# Patient Record
Sex: Male | Born: 1950 | Race: White | Hispanic: No | Marital: Married | Smoking: Former smoker
Health system: Southern US, Community
[De-identification: ages and names within clinical notes are randomized; demographics above are authoritative.]

## PROBLEM LIST (undated history)

## (undated) DIAGNOSIS — E7800 Pure hypercholesterolemia, unspecified: Secondary | ICD-10-CM

## (undated) HISTORY — PX: CHOLECYSTECTOMY: SHX55

---

## 2008-03-17 ENCOUNTER — Inpatient Hospital Stay (HOSPITAL_COMMUNITY): Admission: EM | Admit: 2008-03-17 | Discharge: 2008-03-19 | Payer: Self-pay | Admitting: Emergency Medicine

## 2008-03-18 ENCOUNTER — Encounter (INDEPENDENT_AMBULATORY_CARE_PROVIDER_SITE_OTHER): Payer: Self-pay | Admitting: General Surgery

## 2008-10-31 ENCOUNTER — Emergency Department: Payer: Self-pay | Admitting: Internal Medicine

## 2010-09-13 ENCOUNTER — Ambulatory Visit: Payer: Self-pay | Admitting: General Surgery

## 2011-04-04 NOTE — Discharge Summary (Signed)
NAME:  Ian Hansen, Ian Hansen NO.:  [id number]   MEDICAL RECORD NO.:  [id number]          PATIENT TYPE:  INP   LOCATION:  A322                          FACILITY:  [facility]   PHYSICIAN:  Dalia Heading, M.D.  DATE OF BIRTH:  1951/10/06   DATE OF ADMISSION:  03/17/2008  DATE OF DISCHARGE:  04/30/2009LH                               DISCHARGE SUMMARY   HOSPITAL COURSE SUMMARY:  The patient is a 60 year old white male who  presented to the emergency room with right upper quadrant abdominal pain  and biliary colic symptoms.  Ultrasound of the gallbladder revealed  acute cholecystitis with cholelithiasis.  Surgery consultation was  obtained and the patient was admitted to the hospital for further  evaluation and treatment.  He subsequently went to the operating room on  March 18, 2008, and underwent laparoscopic cholecystectomy.  He  tolerated the procedure well.  His postoperative course has been  remarkable only for a low-grade fever which has since resolved.  The  patient's diet was advanced without difficulty.   The patient is being discharged home on March 19, 2008, in good and  improving condition.   DISCHARGE INSTRUCTIONS:  The patient is to follow up Dr. Franky Macho on  Mar 26, 2008.   DISCHARGE MEDICATIONS:  1. Vicodin 1-2 tablets p.o. q.4 hours p.r.n. pain.  2. Bromelain daily.  3. Fish oil daily.  4. Multivitamin daily.   PRINCIPAL DIAGNOSES:  1. Acute cholecystitis.  2. Cholelithiasis.   PRINCIPAL PROCEDURE:  Laparoscopic cholecystectomy on March 18, 2008.      Dalia Heading, M.D.  Electronically Signed     MAJ/MEDQ  D:  03/19/2008  T:  03/19/2008  Job:  [id number]

## 2011-04-04 NOTE — H&P (Signed)
NAME:  Ian Hansen, Ian Hansen NO.:  [id number]   MEDICAL RECORD NO.:  [id number]          PATIENT TYPE:  EMS   LOCATION:  ED                            FACILITY:  [facility]   PHYSICIAN:  Dalia Heading, M.D.  DATE OF BIRTH:  [birth year]   DATE OF ADMISSION:  03/17/2008  DATE OF DISCHARGE:  LH                              HISTORY & PHYSICAL   AGE:  60 years old.   CHIEF COMPLAINT:  Right upper quadrant abdominal pain.   HISTORY OF PRESENT ILLNESS:  The patient is a 60 year old white male who  presents to the emergency room with worsening right upper quadrant  abdominal pain, nausea, vomiting.  An ultrasound of the gallbladder was  performed which revealed cholelithiasis.  Due to ongoing pain and  nausea, be the patient is being admitted to the hospital for further  evaluation and treatment.   This is his first episode of biliary colic.  He denies any fever,  chills, or jaundice.   PAST MEDICAL HISTORY:  Right shoulder melanoma.   PAST SURGICAL HISTORY:  Excision of melanoma, right shoulder.   CURRENT MEDICATIONS:  None.   ALLERGIES:  No known drug allergies.   REVIEW OF SYSTEMS:  Noncontributory.   PHYSICAL EXAMINATION:  The patient is a well-developed and well-  nourished white male in no acute distress. He is afebrile and vital  signs are stable.  HEENT:  Examination reveals no scleral icterus.  LUNGS:  Clear to auscultation with equal breath sounds bilaterally.  HEART:  Examination reveals regular rate and rhythm without S3, S4, or  murmurs.  ABDOMEN:  Soft, with tenderness down the right upper quadrant to  palpation.  No hepatosplenomegaly, masses, or hernias are noted.   MET 7 is remarkable for a potassium of 3.4.  Liver enzyme tests are  within normal limits.  Lipase is 22.  White blood cell count 15.8,  hematocrit 39, platelet count 142.   Ultrasound of the gallbladder reveals cholelithiasis with a normal  common bile duct.   IMPRESSION:   Cholecystitis, cholelithiasis.   PLAN:  The patient will be admitted to the hospital for intravenous  antibiotic therapy as well as control of his nausea and pain.  He  subsequently will undergo laparoscopic cholecystectomy.  The risks and  benefits of the procedure including bleeding, infection, hepatobiliary  injury and the possibly of an open procedure were fully explained to the  patient, who gave informed consent.      Dalia Heading, M.D.  Electronically Signed     MAJ/MEDQ  D:  03/17/2008  T:  03/17/2008  Job:  [id number]

## 2011-04-04 NOTE — Op Note (Signed)
NAME:  Ian Hansen, Ian Hansen NO.:  [id number]   MEDICAL RECORD NO.:  [id number]          PATIENT TYPE:  INP   LOCATION:  A322                          FACILITY:  [facility]   PHYSICIAN:  Dalia Heading, M.D.  DATE OF BIRTH:  15-Oct-1951   DATE OF PROCEDURE:  03/18/2008  DATE OF DISCHARGE:                               OPERATIVE REPORT   PREOPERATIVE DIAGNOSES:  Cholecystitis, cholelithiasis.   POSTOPERATIVE DIAGNOSES:  Cholecystitis, cholelithiasis.   PROCEDURE:  Laparoscopic cholecystectomy.   SURGEON:  Dalia Heading, MD   ANESTHESIA:  General endotracheal.   INDICATIONS:  The patient is a 60 year old white male who presented to  the emergency room with cholecystitis secondary to cholelithiasis.  The  risks and benefits of the procedure including bleeding, infection,  hepatobiliary injury, the possibility of an open procedure were fully  explained to the patient, gave informed consent.   PROCEDURE NOTE:  The patient was placed in the supine position.  After  induction of general endotracheal anesthesia, the abdomen was prepped  and draped using the usual sterile technique with Betadine.  Surgical  site confirmation was performed.   A supraumbilical incision was made down to the fascia.  A Veress needle  was introduced into the abdominal cavity, and confirmation of placement  was done using the saline drop test.  The abdomen was then insufflated  to 16 mmHg pressure.  An 11-mm trocar was introduced into the abdominal  cavity under direct visualization without difficulty.  An additional 11-  mm trocar was placed in the epigastric region and 5-mm trocar was placed  in the right upper quadrant and right flank regions.  The gallbladder  was noted to be distended and acutely inflamed.  The gallbladder was  decompressed to facilitate dissection.  The dissection was begun around  the infundibulum of the gallbladder.  The cystic duct was first  identified.  Its juncture to  the infundibulum fully identified.  Endoclips were placed proximally and distally on the cystic duct, and  the cystic duct was divided.  This likewise done on the cystic artery.  The gallbladder was then freed away from the gallbladder fossa using  Bovie electrocautery.  The gallbladder was delivered through the  epigastric trocar site using EndoCatch bag.  The gallbladder fossa was  inspected.  No abnormal bleeding or bile leakage was noted.  Surgicel  was placed in the gallbladder fossa.  All fluid narrowed, then evacuated  from the abdominal cavity prior to removal of the trocars.   All wounds were irrigated with normal saline.  All wounds were injected  with 0.5% Sensorcaine.  The supraumbilical fascia was reapproximated  using an 0 Vicryl interrupted suture.  All skin incisions were closed  using staples.  Betadine ointment, dry sterile dressings were applied.   All tape and needle counts were correct at the end of the procedure.  The patient was extubated in the operating room and went back to  recovery room awake in stable condition.   COMPLICATIONS:  None.   SPECIMEN:  Gallbladder.   BLOOD LOSS:  Minimal.      Loraine Leriche  Val Riles, M.D.  Electronically Signed     MAJ/MEDQ  D:  03/18/2008  T:  03/19/2008  Job:  [id number]

## 2011-08-15 LAB — URINALYSIS, ROUTINE W REFLEX MICROSCOPIC
Bilirubin Urine: NEGATIVE
Nitrite: NEGATIVE
Urobilinogen, UA: 0.2

## 2011-08-15 LAB — HEPATIC FUNCTION PANEL
AST: 24
Albumin: 3.9
Total Bilirubin: 0.7
Total Protein: 7.1

## 2011-08-15 LAB — COMPREHENSIVE METABOLIC PANEL
ALT: 30
AST: 23
CO2: 25
Chloride: 103
Creatinine, Ser: 0.99
GFR calc Af Amer: >60
GFR calc non Af Amer: >60
Glucose, Bld: 151 — ABNORMAL HIGH
Total Bilirubin: 0.4

## 2011-08-15 LAB — AMYLASE: Amylase: 22 — ABNORMAL LOW

## 2011-08-15 LAB — DIFFERENTIAL
Basophils Absolute: 0
Basophils Absolute: 0.1
Eosinophils Absolute: 0.2
Eosinophils Relative: 1
Eosinophils Relative: 1
Lymphocytes Relative: 17
Lymphocytes Relative: 8 — ABNORMAL LOW
Neutro Abs: 8.5 — ABNORMAL HIGH
Neutrophils Relative %: 87 — ABNORMAL HIGH

## 2011-08-15 LAB — CBC
Hemoglobin: 13.5
MCV: 86.9
Platelets: 146 — ABNORMAL LOW
RBC: 4.49
RDW: 12.4
WBC: 15.8 — ABNORMAL HIGH

## 2011-08-15 LAB — BASIC METABOLIC PANEL
BUN: 9
Calcium: 8.4
Creatinine, Ser: 0.86
GFR calc non Af Amer: >60
Glucose, Bld: 106 — ABNORMAL HIGH

## 2011-08-15 LAB — LIPASE, BLOOD: Lipase: 22

## 2013-03-22 ENCOUNTER — Emergency Department (HOSPITAL_COMMUNITY)
Admission: EM | Admit: 2013-03-22 | Discharge: 2013-03-22 | Disposition: A | Discharge status: Home / Self Care | Payer: BC Managed Care – PPO | Attending: Emergency Medicine | Admitting: Emergency Medicine

## 2013-03-22 ENCOUNTER — Encounter (HOSPITAL_COMMUNITY): Payer: Self-pay | Admitting: Emergency Medicine

## 2013-03-22 DIAGNOSIS — Z23 Encounter for immunization: Secondary | ICD-10-CM | POA: Insufficient documentation

## 2013-03-22 DIAGNOSIS — S61012A Laceration without foreign body of left thumb without damage to nail, initial encounter: Secondary | ICD-10-CM

## 2013-03-22 DIAGNOSIS — Y929 Unspecified place or not applicable: Secondary | ICD-10-CM | POA: Insufficient documentation

## 2013-03-22 DIAGNOSIS — S61209A Unspecified open wound of unspecified finger without damage to nail, initial encounter: Secondary | ICD-10-CM | POA: Insufficient documentation

## 2013-03-22 DIAGNOSIS — Y9389 Activity, other specified: Secondary | ICD-10-CM | POA: Insufficient documentation

## 2013-03-22 DIAGNOSIS — Z862 Personal history of diseases of the blood and blood-forming organs and certain disorders involving the immune mechanism: Secondary | ICD-10-CM | POA: Insufficient documentation

## 2013-03-22 DIAGNOSIS — W268XXA Contact with other sharp object(s), not elsewhere classified, initial encounter: Secondary | ICD-10-CM | POA: Insufficient documentation

## 2013-03-22 DIAGNOSIS — Z8639 Personal history of other endocrine, nutritional and metabolic disease: Secondary | ICD-10-CM | POA: Insufficient documentation

## 2013-03-22 HISTORY — DX: Pure hypercholesterolemia, unspecified: E78.00

## 2013-03-22 MED ORDER — LIDOCAINE HCL (PF) 1 % IJ SOLN
5.0000 mL | Freq: Once | INTRAMUSCULAR | Status: DC
  Filled 2013-03-22: qty 5

## 2013-03-22 MED ORDER — BACITRACIN 500 UNIT/GM EX OINT
1.0000 "application " | TOPICAL_OINTMENT | Freq: Two times a day (BID) | CUTANEOUS | Status: DC
  Administered 2013-03-22: 1 via TOPICAL
  Filled 2013-03-22 (×5): qty 0.9

## 2013-03-22 MED ORDER — TETANUS-DIPHTH-ACELL PERTUSSIS 5-2.5-18.5 LF-MCG/0.5 IM SUSP
0.5000 mL | Freq: Once | INTRAMUSCULAR | Status: AC
  Administered 2013-03-22: 0.5 mL via INTRAMUSCULAR
  Filled 2013-03-22: qty 0.5

## 2013-03-22 NOTE — ED Notes (Signed)
States that he cut his left thumb about 30 minutes ago on a piece of glass.

## 2013-03-22 NOTE — ED Provider Notes (Signed)
Medical screening examination/treatment/procedure(s) were performed by non-physician practitioner and as supervising physician I was immediately available for consultation/collaboration.  Karynn Deblasi, MD 03/22/13 1811 

## 2013-03-22 NOTE — ED Provider Notes (Signed)
History     CSN: [id number]  Arrival date & time 03/22/13  1634   First MD Initiated Contact with Patient 03/22/13 1650      Chief Complaint  Patient presents with  . Laceration    (Consider location/radiation/quality/duration/timing/severity/associated sxs/prior treatment) Patient is a 62 y.o. male presenting with skin laceration. The history is provided by the patient.  Laceration Location:  Finger Finger laceration location:  L thumb Length (cm):  3 cm Depth:  Cutaneous Quality: straight   Bleeding: controlled   Time since incident:  30 minutes Injury mechanism: piece of glass from a window.  glass was not broken. Pain details:    Quality:  Throbbing   Severity:  Mild   Timing:  Constant   Progression:  Unchanged Foreign body present:  No foreign bodies Relieved by:  Pressure (rinsing with tap water.) Worsened by:  Movement Ineffective treatments:  None tried Tetanus status:  Unknown   Past Medical History  Diagnosis Date  . High cholesterol     Past Surgical History  Procedure Laterality Date  . Cholecystectomy      No family history on file.  History  Substance Use Topics  . Smoking status: Not on file  . Smokeless tobacco: Not on file  . Alcohol Use: Not on file      Review of Systems  Constitutional: Negative for fever and chills.  Musculoskeletal: Negative for back pain, joint swelling and arthralgias.  Skin: Positive for wound. Negative for color change.       Laceration   Neurological: Negative for dizziness, weakness and numbness.  Hematological: Does not bruise/bleed easily.  All other systems reviewed and are negative.    Allergies  Review of patient's allergies indicates no known allergies.  Home Medications  No current outpatient prescriptions on file.  BP 135/75  Pulse 58  Temp(Src) 97.8 F (36.6 C) (Oral)  Resp 18  Ht 6' (1.829 m)  Wt 200 lb (90.719 kg)  BMI 27.12 kg/m2  SpO2 98%  Physical Exam  Nursing note and  vitals reviewed. Constitutional: He is oriented to person, place, and time. He appears well-developed and well-nourished. No distress.  HENT:  Head: Normocephalic and atraumatic.  Cardiovascular: Normal rate, regular rhythm, normal heart sounds and intact distal pulses.   No murmur heard. Pulmonary/Chest: Effort normal and breath sounds normal. No respiratory distress.  Musculoskeletal: He exhibits no edema and no tenderness.       Left hand: He exhibits laceration. He exhibits normal range of motion, no tenderness, no bony tenderness, normal two-point discrimination, normal capillary refill, no deformity and no swelling. Normal sensation noted. Normal strength noted.       Hands: Neurological: He is alert and oriented to person, place, and time. He exhibits normal muscle tone. Coordination normal.  Skin: Laceration noted.  See MS exam    ED Course  Procedures (including critical care time)  Labs Reviewed - No data to display No results found.      MDM    LACERATION REPAIR Performed by: Tavaras Goody L. Authorized by: Maxwell Caul Consent: Verbal consent obtained. Risks and benefits: risks, benefits and alternatives were discussed Consent given by: patient Patient identity confirmed: provided demographic data Prepped and Draped in normal sterile fashion Wound explored  Laceration Location: left medial thumb Laceration Length: 3 cm  No Foreign Bodies seen or palpated  Anesthesia: local infiltration  Local anesthetic: lidocaine 1 % w/o epinephrine  Anesthetic total: 2 ml  Irrigation method: syringe Amount of cleaning:  standard  Skin closure: 4-0 ethilon Number of sutures: 4 Technique: simple interrupted  Patient tolerance: Patient tolerated the procedure well with no immediate complications.     Wound(s) explored with adequate hemostasis through ROM, no apparent gross foreign body retained, no significant involvement of deep structures such as bone /  joint / tendon / or neurovascular involvement noted.  Baseline Strength and Sensation to affected extremity(ies) with normal light touch for Pt, distal NVI with CR< 2 secs and pulse(s) intact to affected extremity(ies).    Pain improved.  Bleeding controlled.  Pt has full ROM of the thumb w/o difficulty.  He agrees to keep wound clean and bandaged.  F/u with PMD for suture removal and to return here for any signs of infection.  Agrees to Tylenol or ibuprofen if needed for pain  Clarine Elrod L. Trisha Mangle, PA-C 03/22/13 1743

## 2013-03-22 NOTE — ED Notes (Signed)
Pt presents with laceration to right thumb. Pt states he cut thumb on piece of glass.

## 2013-04-01 ENCOUNTER — Emergency Department (HOSPITAL_COMMUNITY)
Admission: EM | Admit: 2013-04-01 | Discharge: 2013-04-01 | Disposition: A | Discharge status: Home / Self Care | Payer: BC Managed Care – PPO | Attending: Emergency Medicine | Admitting: Emergency Medicine

## 2013-04-01 ENCOUNTER — Encounter (HOSPITAL_COMMUNITY): Payer: Self-pay | Admitting: Emergency Medicine

## 2013-04-01 DIAGNOSIS — Z8639 Personal history of other endocrine, nutritional and metabolic disease: Secondary | ICD-10-CM | POA: Insufficient documentation

## 2013-04-01 DIAGNOSIS — Z87891 Personal history of nicotine dependence: Secondary | ICD-10-CM | POA: Insufficient documentation

## 2013-04-01 DIAGNOSIS — Z4802 Encounter for removal of sutures: Secondary | ICD-10-CM

## 2013-04-01 DIAGNOSIS — Z862 Personal history of diseases of the blood and blood-forming organs and certain disorders involving the immune mechanism: Secondary | ICD-10-CM | POA: Insufficient documentation

## 2013-04-01 NOTE — ED Notes (Signed)
Patient here for suture removal. Per patient sutures placed 10 days ago after cutting finger on glass.

## 2013-04-01 NOTE — ED Notes (Signed)
Pt had sutures placed in left thumb 10 days ago. Pt is here to have sutures removed. No signs of infection.

## 2013-04-01 NOTE — ED Provider Notes (Signed)
History     CSN: [id number]  Arrival date & time 04/01/13  1159   First MD Initiated Contact with Patient 04/01/13 1221      Chief Complaint  Patient presents with  . Suture / Staple Removal    (Consider location/radiation/quality/duration/timing/severity/associated sxs/prior treatment) Patient is a 62 y.o. male presenting with suture removal. The history is provided by the patient.  Suture / Staple Removal  The sutures were placed 7 to 10 days ago. Treatments since wound repair include regular soap and water washings. Fever duration: no fever. There has been no drainage from the wound. There is no redness present. The swelling has improved. The pain has improved. He has no difficulty moving the affected extremity or digit.    Past Medical History  Diagnosis Date  . High cholesterol     Past Surgical History  Procedure Laterality Date  . Cholecystectomy      Family History  Problem Relation Age of Onset  . Diabetes Mother   . Diabetes Father     History  Substance Use Topics  . Smoking status: Former Smoker -- 0.50 packs/day for 30 years    Types: Cigarettes    Quit date: 11/20/1992  . Smokeless tobacco: Never Used  . Alcohol Use: Yes     Comment: occasional      Review of Systems  Constitutional: Negative for fever and chills.  Musculoskeletal: Negative for back pain, joint swelling and arthralgias.  Skin: Positive for wound.       Laceration   Neurological: Negative for dizziness, weakness and numbness.  Hematological: Does not bruise/bleed easily.  All other systems reviewed and are negative.    Allergies  Adhesive  Home Medications  No current outpatient prescriptions on file.  BP 102/66  Pulse 61  Temp(Src) 97.5 F (36.4 C) (Oral)  Resp 16  Ht 6' (1.829 m)  Wt 200 lb (90.719 kg)  BMI 27.12 kg/m2  SpO2 100%  Physical Exam  Nursing note and vitals reviewed. Constitutional: He is oriented to person, place, and time. He appears  well-developed and well-nourished. No distress.  HENT:  Head: Normocephalic and atraumatic.  Cardiovascular: Normal rate, regular rhythm, normal heart sounds and intact distal pulses.   No murmur heard. Pulmonary/Chest: Effort normal and breath sounds normal. No respiratory distress.  Musculoskeletal: Normal range of motion. He exhibits no edema and no tenderness.  Neurological: He is alert and oriented to person, place, and time. He exhibits normal muscle tone. Coordination normal.  Skin: Laceration noted.  Laceration to the left thumb with sutures in place.  Appears to be healing well.  No drainage, dehiscence or surrounding erythema.  Pt has full ROM of the finger, distal sensation intact,  Radial pulse brisk  CR< 2 sec    ED Course  Procedures (including critical care time)  Labs Reviewed - No data to display No results found.   1. Visit for suture removal     Sutures removed by nursing staff w/o difficulty.    MDM    Wound appears to be healing well.  Pt has full ROM of the finger.  No edema, surrounding erythema or drainage.    The patient appears reasonably screened and/or stabilized for discharge and I doubt any other medical condition or other [facility] requiring further screening, evaluation, or treatment in the ED at this time prior to discharge.       Jamika Sadek L. Trisha Mangle, PA-C 04/01/13 2251

## 2013-04-04 NOTE — ED Provider Notes (Signed)
Medical screening examination/treatment/procedure(s) were performed by non-physician practitioner and as supervising physician I was immediately available for consultation/collaboration. Devoria Albe, MD, Armando Gang   Ward Givens, MD 04/04/13 [phone]

## 2014-06-03 ENCOUNTER — Ambulatory Visit: Payer: Self-pay | Admitting: Family Medicine

## 2017-12-06 DIAGNOSIS — C44111 Basal cell carcinoma of skin of unspecified eyelid, including canthus: Secondary | ICD-10-CM | POA: Insufficient documentation

## 2020-10-18 NOTE — Progress Notes (Signed)
Subjective:    CC: R knee pain  I, Molly Weber, LAT, ATC, am serving as scribe for Dr. Lynne Leader.  HPI: Pt is a 69 y/o male presenting w/ R knee pain.  Pt states that knee pain has been ongoing years. He locates his pain to medial and lateral joint line. Over the last week, pain is also posterior aspect of knee and will radiate down posterior calf.  R knee swelling: no Aggravating factors: driving, sitting at work w/ knee tucked under chair Treatments tried: joint cream, better than blue cream  Pertinent review of Systems: No fevers or chills  Relevant historical information: History basal cell carcinoma of the eyelid.  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   Objective:    Vitals:   10/19/20 1046  BP: 128/82  Pulse: (!) 55  SpO2: 99%   General: Well Developed, well nourished, and in no acute distress.   MSK: Right knee normal-appearing with no effusion normal motion with crepitation. Mildly tender palpation medial and lateral joint line. Stable given his exam. Intact strength. Negative McMurray test.  Lab and Radiology Results  X-ray images right knee obtained today personally and independently interpreted Mild medial compartment DJD.  No acute fractures.  Circular calcified change cortex of proximal tibia anterior distal to the patellar tendon insertion possibly represents bone island. Await formal radiology review  Procedure: Real-time Ultrasound Guided Injection of right knee superior lateral patellar space Device: Philips Affiniti 50G Images permanently stored and available for review in PACS Diagnostic ultrasound of the knee reveals small joint effusion no Baker's cyst.  Mild DJD Verbal informed consent obtained.  Discussed risks and benefits of procedure. Warned about infection bleeding damage to structures skin hypopigmentation and fat atrophy among others. Patient expresses understanding and agreement Time-out conducted.     Noted no overlying erythema, induration, or other signs of local infection.   Skin prepped in a sterile fashion.   Local anesthesia: Topical Ethyl chloride.   With sterile technique and under real time ultrasound guidance:  40 mg of Kenalog and 2 mL of Marcaine injected into knee. Fluid seen entering the joint capsule.   Completed without difficulty   Pain immediately resolved suggesting accurate placement of the medication.   Advised to call if fevers/chills, erythema, induration, drainage, or persistent bleeding.   Images permanently stored and available for review in the ultrasound unit.  Impression: Technically successful ultrasound guided injection.      Impression and Recommendations:    Assessment and Plan: 69 y.o. male with right knee pain thought to be due to DJD.  Possible more significant generation not well visualized on x-ray or ultrasound today is the cause of pain..  Plan for Voltaren gel exercise program and steroid injection as above.  Check back if not improving.  PDMP not reviewed this encounter. Orders Placed This Encounter  Procedures  . DG Knee AP/LAT W/Sunrise Right    Standing Status:   Future    Number of Occurrences:   1    Standing Expiration Date:   10/19/2021    Order Specific Question:   Reason for Exam (SYMPTOM  OR DIAGNOSIS REQUIRED)    Answer:   chronic right knee pain    Order Specific Question:   Preferred imaging location?    Answer:   [facility]  . [place] LIMITED JOINT SPACE STRUCTURES LOW RIGHT(NO LINKED CHARGES)    Standing Status:   Future    Number  of Occurrences:   1    Standing Expiration Date:   04/18/2021    Order Specific Question:   Reason for Exam (SYMPTOM  OR DIAGNOSIS REQUIRED)    Answer:   chronic right knee pain    Order Specific Question:   Preferred imaging location?    Answer:   [facility]   No orders of the defined types were placed in this encounter.   Discussed warning signs or  symptoms. Please see discharge instructions. Patient expresses understanding.   The above documentation has been reviewed and is accurate and complete Lynne Leader, M.D.

## 2020-10-19 ENCOUNTER — Ambulatory Visit (INDEPENDENT_AMBULATORY_CARE_PROVIDER_SITE_OTHER): Payer: Medicare Other | Admitting: Family Medicine

## 2020-10-19 ENCOUNTER — Ambulatory Visit (INDEPENDENT_AMBULATORY_CARE_PROVIDER_SITE_OTHER): Payer: Medicare Other

## 2020-10-19 ENCOUNTER — Ambulatory Visit: Payer: Self-pay

## 2020-10-19 ENCOUNTER — Other Ambulatory Visit: Payer: Self-pay

## 2020-10-19 VITALS — BP 128/82 | HR 55 | Ht 72.0 in | Wt 209.8 lb

## 2020-10-19 DIAGNOSIS — M25561 Pain in right knee: Secondary | ICD-10-CM

## 2020-10-19 DIAGNOSIS — G8929 Other chronic pain: Secondary | ICD-10-CM

## 2020-10-19 NOTE — Patient Instructions (Signed)
Thank you for coming in today.  Please get an Xray today before you leave  Please use voltaren gel up to 4x daily for pain as needed.   Call or go to the ER if you develop a large red swollen joint with extreme pain or oozing puss.   Please follow up as needed.

## 2020-10-19 NOTE — Progress Notes (Signed)
X-ray right knee shows mild arthritis changes.

## 2020-11-26 ENCOUNTER — Other Ambulatory Visit: Payer: Self-pay | Admitting: General Surgery

## 2020-11-26 NOTE — Progress Notes (Signed)
**Note Ian-Identified via Obfuscation** Subjective:     Patient ID: Ian Hansen is a 70 y.o. male.  HPI  The following portions of the patient's history were reviewed and updated as appropriate.  This an established patient is here today for: office visit. Here for evaluation of a colonoscopy, last completed in 2011. Bowels move daily, no bleeding.  He is here with his wife, Ian Hansen.   Review of Systems  Constitutional: Negative for chills and fever.  Respiratory: Negative for cough.     No chief complaint on file.    Ht 181.6 cm (5' 11.5")   Wt 92.1 kg (203 lb)   BMI 27.92 kg/m       Past Medical History:  Diagnosis Date  . High cholesterol           Past Surgical History:  Procedure Laterality Date  . CHOLECYSTECTOMY    . COLONOSCOPY  09/13/2010   Normal Colon/Repeat 43yrs/[facility]       Social History          Socioeconomic History  . Marital status: Unknown    Spouse name: Not on file  . Number of children: Not on file  . Years of education: Not on file  . Highest education level: Not on file  Occupational History  . Not on file  Tobacco Use  . Smoking status: Former Smoker    Packs/day: 0.50    Years: 30.00    Pack years: 15.00    Types: Cigarettes    Quit date: 11/20/1992    Years since quitting: 28.0  . Smokeless tobacco: Never Used  Substance and Sexual Activity  . Alcohol use: Yes  . Drug use: Never  . Sexual activity: Not on file  Other Topics Concern  . Not on file  Social History Narrative  . Not on file   Social Determinants of Health   Financial Resource Strain: Not on file  Food Insecurity: Not on file  Transportation Needs: Not on file           Allergies  Allergen Reactions  . Adhesive Tape-Silicones Unknown    Current Medications        Current Outpatient Medications  Medication Sig Dispense Refill  . coenzyme Q10 (CO Q-10) 10 mg capsule Take 10 mg by mouth once daily    . diclofenac (VOLTAREN) 1 % topical  gel Apply 2 g topically as needed    . multivit-min/iron/folic acid/K (ADULTS MULTIVITAMIN ORAL) Take by mouth once daily    . omega 3-dha-epa-fish oil (FISH OIL) 100-160-1,000 mg Cap Take by mouth once daily    . RED YEAST RICE ORAL Take by mouth once daily    . polyethylene glycol (MIRALAX) powder One bottle for colonoscopy prep. Use as directed. 255 g 0   No current facility-administered medications for this visit.           Family History  Problem Relation Age of Onset  . Diabetes Mother   . Diabetes Father   . Lung cancer Brother   . Colon cancer Neg Hx   . Breast cancer Neg Hx         Objective:   Physical Exam Constitutional:      Appearance: Normal appearance.  Cardiovascular:     Rate and Rhythm: Normal rate and regular rhythm.     Pulses: Normal pulses.     Heart sounds: Normal heart sounds.  Pulmonary:     Effort: Pulmonary effort is normal.     Breath sounds: Normal breath  sounds.  Musculoskeletal:     Cervical back: Neck supple.  Skin:    General: Skin is warm and dry.  Neurological:     General: No focal deficit present.     Mental Status: He is alert and oriented to person, place, and time.  Psychiatric:        Behavior: Behavior normal.     Labs and Radiology:   Prior colonoscopy studies could not be located.      Assessment:     Candidate for screening colonoscopy.    Plan:     Options for screening with Cologuard versus colonoscopy reviewed.  Patient would like to proceed with colonoscopy.  He was instructed by the staff in regards to preparation.  Surgery is presently scheduled for December 15, 2020.    Entered by Karie Fetch, RN, acting as a scribe for Dr. Hervey Ard, MD.  The documentation recorded by the scribe accurately reflects the service I personally performed and the decisions made by me.   Robert Bellow, MD FACS

## 2020-12-13 ENCOUNTER — Other Ambulatory Visit: Payer: Self-pay

## 2020-12-13 ENCOUNTER — Other Ambulatory Visit
Admission: RE | Admit: 2020-12-13 | Discharge: 2020-12-13 | Disposition: A | Discharge status: Home / Self Care | Payer: Medicare Other | Source: Ambulatory Visit | Attending: General Surgery | Admitting: General Surgery

## 2020-12-13 DIAGNOSIS — Z20822 Contact with and (suspected) exposure to covid-19: Secondary | ICD-10-CM | POA: Insufficient documentation

## 2020-12-13 DIAGNOSIS — Z01812 Encounter for preprocedural laboratory examination: Secondary | ICD-10-CM | POA: Insufficient documentation

## 2020-12-13 LAB — SARS CORONAVIRUS 2 (TAT 6-24 HRS): SARS Coronavirus 2: NEGATIVE

## 2020-12-14 ENCOUNTER — Encounter: Payer: Self-pay | Admitting: General Surgery

## 2020-12-15 ENCOUNTER — Encounter
Admission: RE | Disposition: A | Discharge status: Home / Self Care | Payer: Self-pay | Source: Home / Self Care | Attending: General Surgery

## 2020-12-15 ENCOUNTER — Encounter: Payer: Self-pay | Admitting: General Surgery

## 2020-12-15 ENCOUNTER — Ambulatory Visit: Payer: Medicare Other | Admitting: Registered Nurse

## 2020-12-15 ENCOUNTER — Other Ambulatory Visit: Payer: Self-pay

## 2020-12-15 ENCOUNTER — Ambulatory Visit
Admission: RE | Admit: 2020-12-15 | Discharge: 2020-12-15 | Disposition: A | Discharge status: Home / Self Care | Payer: Medicare Other | Attending: General Surgery | Admitting: General Surgery

## 2020-12-15 DIAGNOSIS — Z888 Allergy status to other drugs, medicaments and biological substances status: Secondary | ICD-10-CM | POA: Diagnosis not present

## 2020-12-15 DIAGNOSIS — Z87891 Personal history of nicotine dependence: Secondary | ICD-10-CM | POA: Insufficient documentation

## 2020-12-15 DIAGNOSIS — Z1211 Encounter for screening for malignant neoplasm of colon: Secondary | ICD-10-CM | POA: Insufficient documentation

## 2020-12-15 HISTORY — PX: COLONOSCOPY WITH PROPOFOL: SHX5780

## 2020-12-15 SURGERY — COLONOSCOPY WITH PROPOFOL
Anesthesia: General

## 2020-12-15 MED ORDER — PROPOFOL 500 MG/50ML IV EMUL
INTRAVENOUS | Status: DC | PRN
  Administered 2020-12-15: 150 ug/kg/min via INTRAVENOUS

## 2020-12-15 MED ORDER — PROPOFOL 10 MG/ML IV BOLUS
INTRAVENOUS | Status: DC | PRN
  Administered 2020-12-15 (×2): 10 mg via INTRAVENOUS
  Administered 2020-12-15: 60 mg via INTRAVENOUS
  Administered 2020-12-15: 10 mg via INTRAVENOUS

## 2020-12-15 MED ORDER — SODIUM CHLORIDE 0.9 % IV SOLN
INTRAVENOUS | Status: DC
  Administered 2020-12-15: 20 mL/h via INTRAVENOUS

## 2020-12-15 MED ORDER — LIDOCAINE HCL (CARDIAC) PF 100 MG/5ML IV SOSY
PREFILLED_SYRINGE | INTRAVENOUS | Status: DC | PRN
  Administered 2020-12-15: 100 mg via INTRAVENOUS

## 2020-12-15 NOTE — H&P (Signed)
KHA HARI [id number] August 12, 1951     HPI:  Healthy 70 y/o for screening colonoscopy. Tolerated prep well.   Medications Prior to Admission  Medication Sig Dispense Refill Last Dose  . co-enzyme Q-10 30 MG capsule Take 10 mg by mouth daily.   Past Week at Unknown time  . diclofenac Sodium (VOLTAREN) 1 % GEL Apply 2 g topically 4 (four) times daily as needed.   Past Week at Unknown time  . Multiple Vitamin (MULTIVITAMIN) capsule Take 1 capsule by mouth daily.   Past Week at Unknown time  . Omega-3 Fatty Acids (FISH OIL) 1000 MG CAPS Take 1,000 mg by mouth.   Past Week at Unknown time  . Red Yeast Rice Extract (RED YEAST RICE PO) Take by mouth.   Past Week at Unknown time   Allergies  Allergen Reactions  . Adhesive [Tape]    Past Medical History:  Diagnosis Date  . High cholesterol    Past Surgical History:  Procedure Laterality Date  . CHOLECYSTECTOMY     Social History   Socioeconomic History  . Marital status: Married    Spouse name: Not on file  . Number of children: Not on file  . Years of education: Not on file  . Highest education level: Not on file  Occupational History  . Not on file  Tobacco Use  . Smoking status: Former Smoker    Packs/day: 0.50    Years: 30.00    Pack years: 15.00    Types: Cigarettes    Quit date: 11/20/1992    Years since quitting: 28.0  . Smokeless tobacco: Never Used  Substance and Sexual Activity  . Alcohol use: Yes    Comment: occasional  . Drug use: No  . Sexual activity: Not on file  Other Topics Concern  . Not on file  Social History Narrative  . Not on file   Social Determinants of Health   Financial Resource Strain: Not on file  Food Insecurity: Not on file  Transportation Needs: Not on file  Physical Activity: Not on file  Stress: Not on file  Social Connections: Not on file  Intimate Partner Violence: Not on file   Social History   Social History Narrative  . Not on file     ROS: Negative.      PE: HEENT: Negative. Lungs: Clear. Cardio: RR.  Assessment/Plan:  Proceed with planned screening endoscopy.  [facility] 12/15/2020

## 2020-12-15 NOTE — Transfer of Care (Signed)
Immediate Anesthesia Transfer of Care Note  Patient: Ian Hansen  Procedure(s) Performed: COLONOSCOPY WITH PROPOFOL (N/A )  Patient Location: Endoscopy Unit  Anesthesia Type:General  Level of Consciousness: drowsy  Airway & Oxygen Therapy: Patient Spontanous Breathing  Post-op Assessment: Report given to RN and Post -op Vital signs reviewed and stable  Post vital signs: Reviewed and stable  Last Vitals:  Vitals Value Taken Time  BP 99/61 12/15/20 0847  Temp    Pulse 57 12/15/20 0847  Resp 9 12/15/20 0847  SpO2 99 % 12/15/20 0847  Vitals shown include unvalidated device data.  Last Pain:  Vitals:   12/15/20 0747  TempSrc: Temporal  PainSc: 0-No pain         Complications: No complications documented.

## 2020-12-15 NOTE — Op Note (Signed)
[facility] Patient Name: Ian Hansen Procedure Date: 12/15/2020 8:07 AM MRN: [id number] Account #: [id number] Date of Birth: 20-Jun-1951 Admit Type: Outpatient Age: 70 Room: [facility] ENDO ROOM 1 Gender: Male Note Status: Finalized Procedure:             Colonoscopy Indications:           Screening for colorectal malignant neoplasm Providers:             Robert Bellow, MD Referring MD:          Leona Carry. Hall Busing, MD (Referring MD) Medicines:             Monitored Anesthesia Care Complications:         No immediate complications. Procedure:             Pre-Anesthesia Assessment:                        - Prior to the procedure, a History and Physical was                         performed, and patient medications, allergies and                         sensitivities were reviewed. The patient's tolerance                         of previous anesthesia was reviewed.                        - The risks and benefits of the procedure and the                         sedation options and risks were discussed with the                         patient. All questions were answered and informed                         consent was obtained.                        After obtaining informed consent, the colonoscope was                         passed under direct vision. Throughout the procedure,                         the patient's blood pressure, pulse, and oxygen                         saturations were monitored continuously. The                         Colonoscope was introduced through the anus and                         advanced to the the cecum, identified by appendiceal                         orifice and  ileocecal valve. The colonoscopy was                         somewhat difficult due to significant looping and a                         tortuous colon. Successful completion of the procedure                         was aided by using manual pressure. The  patient                         tolerated the procedure well. The quality of the bowel                         preparation was excellent. Findings:      The entire examined colon appeared normal on direct and retroflexion       views. Impression:            - The entire examined colon is normal on direct and                         retroflexion views.                        - No specimens collected. Recommendation:        - Repeat colonoscopy in 10 years for screening                         purposes. Procedure Code(s):     --- Professional ---                        [phone], Colonoscopy, flexible; diagnostic, including                         collection of specimen(s) by brushing or washing, when                         performed (separate procedure) Diagnosis Code(s):     --- Professional ---                        Z12.11, Encounter for screening for malignant neoplasm                         of colon CPT copyright 2019 American Medical Association. All rights reserved. The codes documented in this report are preliminary and upon coder review may  be revised to meet current compliance requirements. Robert Bellow, MD 12/15/2020 8:44:13 AM This report has been signed electronically. Number of Addenda: 0 Note Initiated On: 12/15/2020 8:07 AM Scope Withdrawal Time: 0 hours 12 minutes 41 seconds  Total Procedure Duration: 0 hours 30 minutes 25 seconds  Estimated Blood Loss:  Estimated blood loss: none.      [facility]

## 2020-12-15 NOTE — Anesthesia Postprocedure Evaluation (Signed)
Anesthesia Post Note  Patient: RAMAR NOBREGA  Procedure(s) Performed: COLONOSCOPY WITH PROPOFOL (N/A )  Patient location during evaluation: Endoscopy Anesthesia Type: General Level of consciousness: awake and alert and oriented Pain management: pain level controlled Vital Signs Assessment: post-procedure vital signs reviewed and stable Respiratory status: spontaneous breathing Cardiovascular status: blood pressure returned to baseline Anesthetic complications: no   No complications documented.   Last Vitals:  Vitals:   12/15/20 0910 12/15/20 0920  BP: 117/63 125/67  Pulse: (!) 54 (!) 49  Resp: 10 13  Temp:    SpO2: 100% 100%    Last Pain:  Vitals:   12/15/20 0747  TempSrc: Temporal  PainSc: 0-No pain                 Myndi Wamble

## 2020-12-15 NOTE — Anesthesia Preprocedure Evaluation (Signed)
Anesthesia Evaluation  Patient identified by MRN, date of birth, ID band Patient awake    Reviewed: Allergy & Precautions, NPO status , Patient's Chart, lab work & pertinent test results  Airway Mallampati: II  TM Distance: >3 FB     Dental  (+) Chipped, Caps   Pulmonary former smoker,    Pulmonary exam normal        Cardiovascular negative cardio ROS Normal cardiovascular exam     Neuro/Psych negative neurological ROS  negative psych ROS   GI/Hepatic negative GI ROS, Neg liver ROS,   Endo/Other  negative endocrine ROS  Renal/GU negative Renal ROS  negative genitourinary   Musculoskeletal   Abdominal Normal abdominal exam  (+)   Peds negative pediatric ROS (+)  Hematology negative hematology ROS (+)   Anesthesia Other Findings Past Medical History: No date: High cholesterol  Reproductive/Obstetrics                             Anesthesia Physical Anesthesia Plan  ASA: II  Anesthesia Plan: General   Post-op Pain Management:    Induction: Intravenous  PONV Risk Score and Plan: Propofol infusion  Airway Management Planned:   Additional Equipment:   Intra-op Plan:   Post-operative Plan:   Informed Consent: I have reviewed the patients History and Physical, chart, labs and discussed the procedure including the risks, benefits and alternatives for the proposed anesthesia with the patient or authorized representative who has indicated his/her understanding and acceptance.     Dental advisory given  Plan Discussed with: CRNA  Anesthesia Plan Comments:         Anesthesia Quick Evaluation

## 2020-12-16 ENCOUNTER — Encounter: Payer: Self-pay | Admitting: General Surgery

## 2022-02-04 LAB — LAB REPORT - SCANNED: PSA, Total: 5.2

## 2022-02-20 ENCOUNTER — Encounter: Payer: Self-pay | Admitting: Urology

## 2022-02-20 ENCOUNTER — Ambulatory Visit (INDEPENDENT_AMBULATORY_CARE_PROVIDER_SITE_OTHER): Payer: Medicare Other | Admitting: Urology

## 2022-02-20 VITALS — BP 122/75 | HR 60 | Ht 72.0 in | Wt 195.0 lb

## 2022-02-20 DIAGNOSIS — R972 Elevated prostate specific antigen [PSA]: Secondary | ICD-10-CM | POA: Diagnosis not present

## 2022-02-20 LAB — URINALYSIS, ROUTINE W REFLEX MICROSCOPIC
Bilirubin, UA: NEGATIVE
Glucose, UA: NEGATIVE
Ketones, UA: NEGATIVE
Leukocytes,UA: NEGATIVE
Nitrite, UA: NEGATIVE
Protein,UA: NEGATIVE
RBC, UA: NEGATIVE
Specific Gravity, UA: 1.02 (ref 1.005–1.030)
Urobilinogen, Ur: 0.2 mg/dL (ref 0.2–1.0)
pH, UA: 6.5 (ref 5.0–7.5)

## 2022-02-20 LAB — BLADDER SCAN AMB NON-IMAGING: Scan Result: 35

## 2022-02-20 MED ORDER — LEVOFLOXACIN 750 MG PO TABS: 750.0000 mg | ORAL_TABLET | Freq: Once | ORAL | 0 refills | Status: AC

## 2022-02-20 NOTE — Patient Instructions (Addendum)
? ?  Appointment Time: Arrive 12pm ?Appointment Date: 03/06/2022 ? ?Location: [facility]  ? ?Prostate Biopsy Instructions ? ?Stop all aspirin or blood thinners (aspirin, plavix, coumadin, warfarin, motrin, ibuprofen, advil, aleve, naproxen, naprosyn) for 7 days prior to the procedure.  If you have any questions about stopping these medications, please contact your primary care physician or cardiologist. ? ?Having a light meal prior to the procedure is recommended.  If you are diabetic or have low blood sugar please bring a small snack or glucose tablet. ? ?A Fleets enema is needed to be purchased over the counter at a local pharmacy and used 2 hours before you scheduled appointment.  This can be purchased over the counter at any pharmacy. ? ?Antibiotics will be administered in the clinic at the time of the procedure and 1 tablet has been sent to your pharmacy. Please take the antibiotic as prescribed.   ? ?Please bring someone with you to the procedure to drive you home if you are given a valium to take prior to your procedure. ? ? ?If you have any questions or concerns, please feel free to call the office at [phone] or send a Mychart message. ? ? ? ?Thank you, ?[facility]  ?

## 2022-02-20 NOTE — Progress Notes (Signed)
? ?02/20/2022 ?11:17 AM  ? ?Ian Hansen ?[birth year] ?[id number] ? ?Referring provider: Albina Billet, MD ?[address]   ?[place],  [place] [address] ? ?No chief complaint on file. ? ? ?HPI: ? ?New patient- ? ?1) PSA elevation-Ian Hansen is a 71 year old male referred for elevated PSA.  His November 2022 PSA was 4.6 and March 2023 PSA was 5.2.  He has no family history of prostate cancer.  He has no prior prostate biopsy.  He has a history of BPH on surveillance. ? 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  ? 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  ? ?PMH: ?Past Medical History:  ?Diagnosis Date  ? High cholesterol   ? ? ?Surgical History: ?Past Surgical History:  ?Procedure Laterality Date  ? CHOLECYSTECTOMY    ? COLONOSCOPY WITH PROPOFOL N/A 12/15/2020  ? Procedure: COLONOSCOPY WITH PROPOFOL;  Surgeon: Robert Bellow, MD;  Location[facility] ENDOSCOPY;  Service: Endoscopy;  Laterality: N/A;  ? ? ?Home Medications:  ?Allergies as of 02/20/2022   ? ?   Reactions  ? Adhesive [tape]   ? ?  ? ?  ?Medication List  ?  ? ?  ? Accurate as of February 20, 2022 11:17 AM. If you have any questions, ask your nurse or doctor.  ?  ?  ? ?  ? ?co-enzyme Q-10 30 MG capsule ?Take 10 mg by mouth daily. ?  ?diclofenac Sodium 1 % Gel ?Commonly known as: VOLTAREN ?Apply 2 g topically 4 (four) times daily as needed. ?  ?Fish Oil 1000 MG Caps ?Take 1,000 mg by mouth. ?  ?multivitamin capsule ?Take 1 capsule by mouth daily. ?  ?RED YEAST RICE PO ?Take by mouth. ?  ? ?  ? ? ?Allergies:  ?Allergies  ?Allergen Reactions  ? Adhesive [Tape]   ? ? ?Family History: ?Family History  ?Problem Relation Age of Onset  ? Diabetes Mother   ? Diabetes Father   ? ? ?Social History:  reports that he quit smoking about 29 years ago. His smoking use included cigarettes. He has a 15.00 pack-year smoking history. He has never used smokeless tobacco. He reports current alcohol use. He reports that he does not use drugs. ? ? ?Physical Exam: ?BP 122/75   Pulse 60   Ht 6' (1.829 m)   Wt 195 lb  (88.5 kg)   BMI 26.45 kg/m?   ?Constitutional:  Alert and oriented, No acute distress. ?HEENT: [place] AT, moist mucus membranes.  Trachea midline, no masses. ?Cardiovascular: No clubbing, cyanosis, or edema. ?Respiratory: Normal respiratory effort, no increased work of breathing. ?GI: Abdomen is soft, nontender, nondistended, no abdominal masses ?GU: No CVA tenderness ?Lymph: No cervical or inguinal lymphadenopathy. ?Skin: No rashes, bruises or suspicious lesions. ?Neurologic: Grossly intact, no focal deficits, moving all 4 extremities. ?Psychiatric: Normal mood and affect. ?GU: Penis circumcised, normal foreskin, testicles descended bilaterally and palpably normal, bilateral epididymis palpably normal, scrotum normal ?DRE: Prostate 30 g, smooth without hard area or nodule ? ? ?Laboratory Data: ?Lab Results  ?Component Value Date  ? WBC 11.6 (H) 03/18/2008  ? HGB 13.2 03/18/2008  ? HCT 38.4 (L) 03/18/2008  ? MCV 88.3 03/18/2008  ? PLT 146 (L) 03/18/2008  ? ? ?Lab Results  ?Component Value Date  ? CREATININE 0.86 03/18/2008  ? ? ?No results found for: PSA ? ?No results found for: TESTOSTERONE ? ?No results found for: HGBA1C ? ?Urinalysis ?   ?Component Value Date/Time  ? [place] YELLOW 03/17/2008 0233  ? APPEARANCEUR  CLEAR 03/17/2008 0233  ? LABSPEC 1.025 03/17/2008 0233  ? PHURINE 6.5 03/17/2008 0233  ? [place] NEGATIVE 03/17/2008 0233  ? [place] NEGATIVE 03/17/2008 0233  ? [place] NEGATIVE 03/17/2008 0233  ? KETONESUR TRACE (A) 03/17/2008 0233  ? [place] NEGATIVE 03/17/2008 0233  ? UROBILINOGEN 0.2 03/17/2008 0233  ? NITRITE NEGATIVE 03/17/2008 0233  ? LEUKOCYTESUR  03/17/2008 0233  ?  NEGATIVE MICROSCOPIC NOT DONE ON URINES WITH NEGATIVE PROTEIN, BLOOD, LEUKOCYTES, NITRITE, OR GLUCOSE <1000 mg/dL.  ? ? ?No results found for: LABMICR, [place], RBCUA, LABEPIT, MUCUS, BACTERIA ? ?Pertinent Imaging: ?N/a  ? ? ?Assessment & Plan:   ? ?1. Elevated PSA ?I had a long discussion with the patient on the nature of  elevated PSA - benign vs malignant causes. We discussed age specific levels and that PCa can be seen on a biopsy with very low PSA levels (<=2.5). We discussed the nature risks and benefits of continued surveillance, other lab tests, imaging as well as prostate biopsy. We discussed the management of prostate cancer might include active surveillance or treatment depending on biopsy findings. All questions answered. His DRE was normal but his PSA was recently repeated, it remains elevated with a fast PSAV. He elects to proceed with biopsy.  ? ?- Urinalysis, Routine w reflex microscopic ?- BLADDER SCAN AMB NON-IMAGING ? ? ?No follow-ups on file. ? ?Festus Aloe, MD ? ?[facility]  ?[place], [place] [address] ?[phone] ? ? ?

## 2022-02-20 NOTE — Progress Notes (Signed)
post void residual=35 

## 2022-03-06 ENCOUNTER — Encounter (HOSPITAL_COMMUNITY): Payer: Self-pay

## 2022-03-06 ENCOUNTER — Ambulatory Visit (HOSPITAL_COMMUNITY)
Admission: RE | Admit: 2022-03-06 | Discharge: 2022-03-06 | Disposition: A | Discharge status: Home / Self Care | Payer: Medicare Other | Source: Ambulatory Visit | Attending: Urology | Admitting: Urology

## 2022-03-06 ENCOUNTER — Ambulatory Visit (HOSPITAL_BASED_OUTPATIENT_CLINIC_OR_DEPARTMENT_OTHER): Payer: Medicare Other | Admitting: Urology

## 2022-03-06 ENCOUNTER — Other Ambulatory Visit: Payer: Self-pay | Admitting: Urology

## 2022-03-06 DIAGNOSIS — R972 Elevated prostate specific antigen [PSA]: Secondary | ICD-10-CM | POA: Diagnosis present

## 2022-03-06 MED ORDER — GENTAMICIN SULFATE 40 MG/ML IJ SOLN
INTRAMUSCULAR | Status: AC
  Administered 2022-03-06: 160 mg via INTRAMUSCULAR
  Filled 2022-03-06: qty 4

## 2022-03-06 MED ORDER — GENTAMICIN SULFATE 40 MG/ML IJ SOLN: 160.0000 mg | Freq: Once | INTRAMUSCULAR | Status: AC

## 2022-03-06 MED ORDER — LIDOCAINE HCL (PF) 2 % IJ SOLN
INTRAMUSCULAR | Status: AC
  Administered 2022-03-06: 10 mL
  Filled 2022-03-06: qty 10

## 2022-03-06 MED ORDER — LIDOCAINE HCL (PF) 2 % IJ SOLN: 10.0000 mL | Freq: Once | INTRAMUSCULAR | Status: AC

## 2022-03-06 NOTE — Progress Notes (Signed)
Prostate Biopsy Procedure  ? ?HPI: ? ?1) PSA elevation-Ian Hansen is a 71 year old male referred for elevated PSA.  His November 2022 PSA was 4.6 and March 2023 PSA was 5.2.  He has no family history of prostate cancer.  He has no prior prostate biopsy.  He has a history of BPH on surveillance. ?  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  ?  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  ? ? ?Informed consent was obtained after discussing risks/benefits of the procedure.  A time out was performed to ensure correct patient identity. ? ?Pre-Procedure: ?- Gentamicin given prophylactically ?- Levaquin 500 mg administered PO ?-Transrectal Ultrasound performed revealing a 62.78 gm prostate (L 5.86, W 4.86, H 4.21) ?-No significant hypoechoic area or median lobe noted ? ?Procedure: ?- Prostate block performed using 10 cc 1% lidocaine and biopsies taken from sextant areas, a total of 12 under ultrasound guidance. ? ?Post-Procedure: ?- Patient tolerated the procedure well ?- He was counseled to seek immediate medical attention if experiences any severe pain, significant bleeding, or fevers ?- Return in one to two weeks to discuss biopsy results  ?

## 2022-03-06 NOTE — Progress Notes (Signed)
PT tolerated prostate biopsy procedure and antibiotic injections well today. Labs obtained and sent for pathology. PT ambulatory at discharge with no acute distress noted and verbalized understanding of discharge instructions. PT to follow up with urologist as scheduled. ?

## 2022-03-20 ENCOUNTER — Ambulatory Visit: Payer: Medicare Other | Admitting: Urology

## 2022-05-21 IMAGING — DX DG KNEE AP/LAT W/ SUNRISE*R*
3 series · 3 of 3 positions shown · non-contrast
Comparison: None.

CLINICAL DATA: Chronic worsening knee pain.

EXAM:
RIGHT KNEE 3 VIEWS

[knee ap]
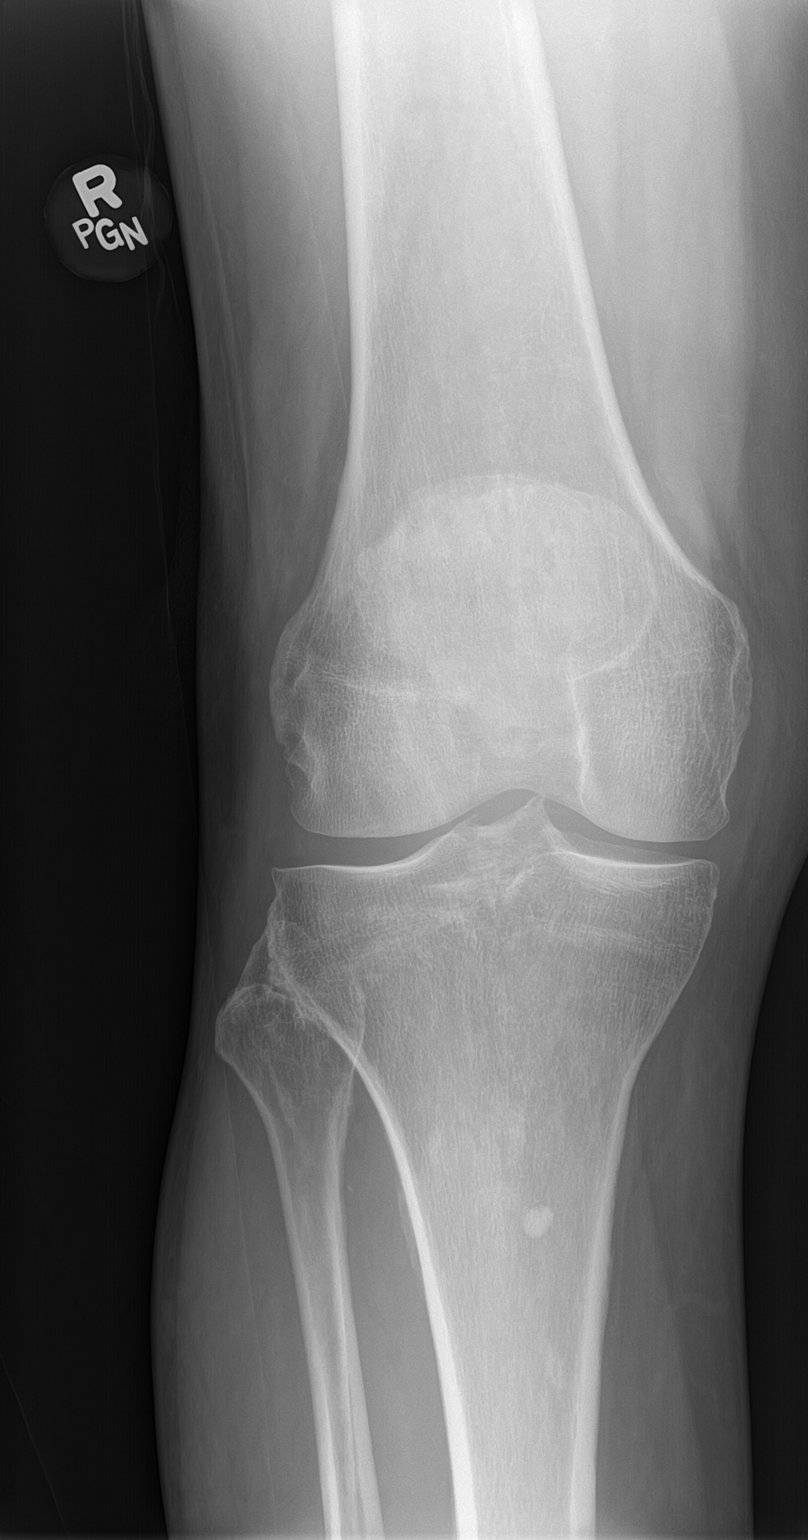

[knee lat]
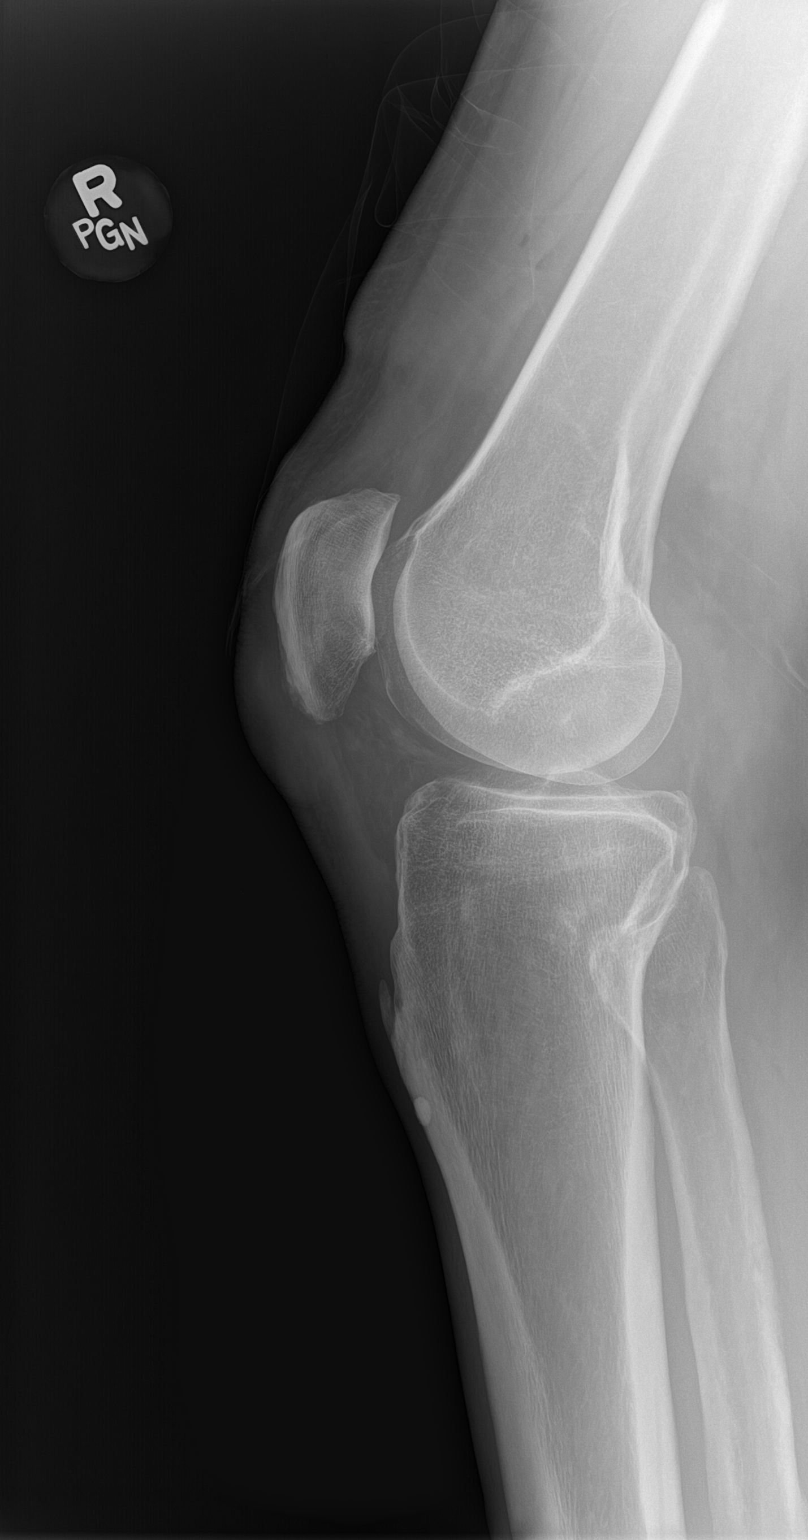

[patella]
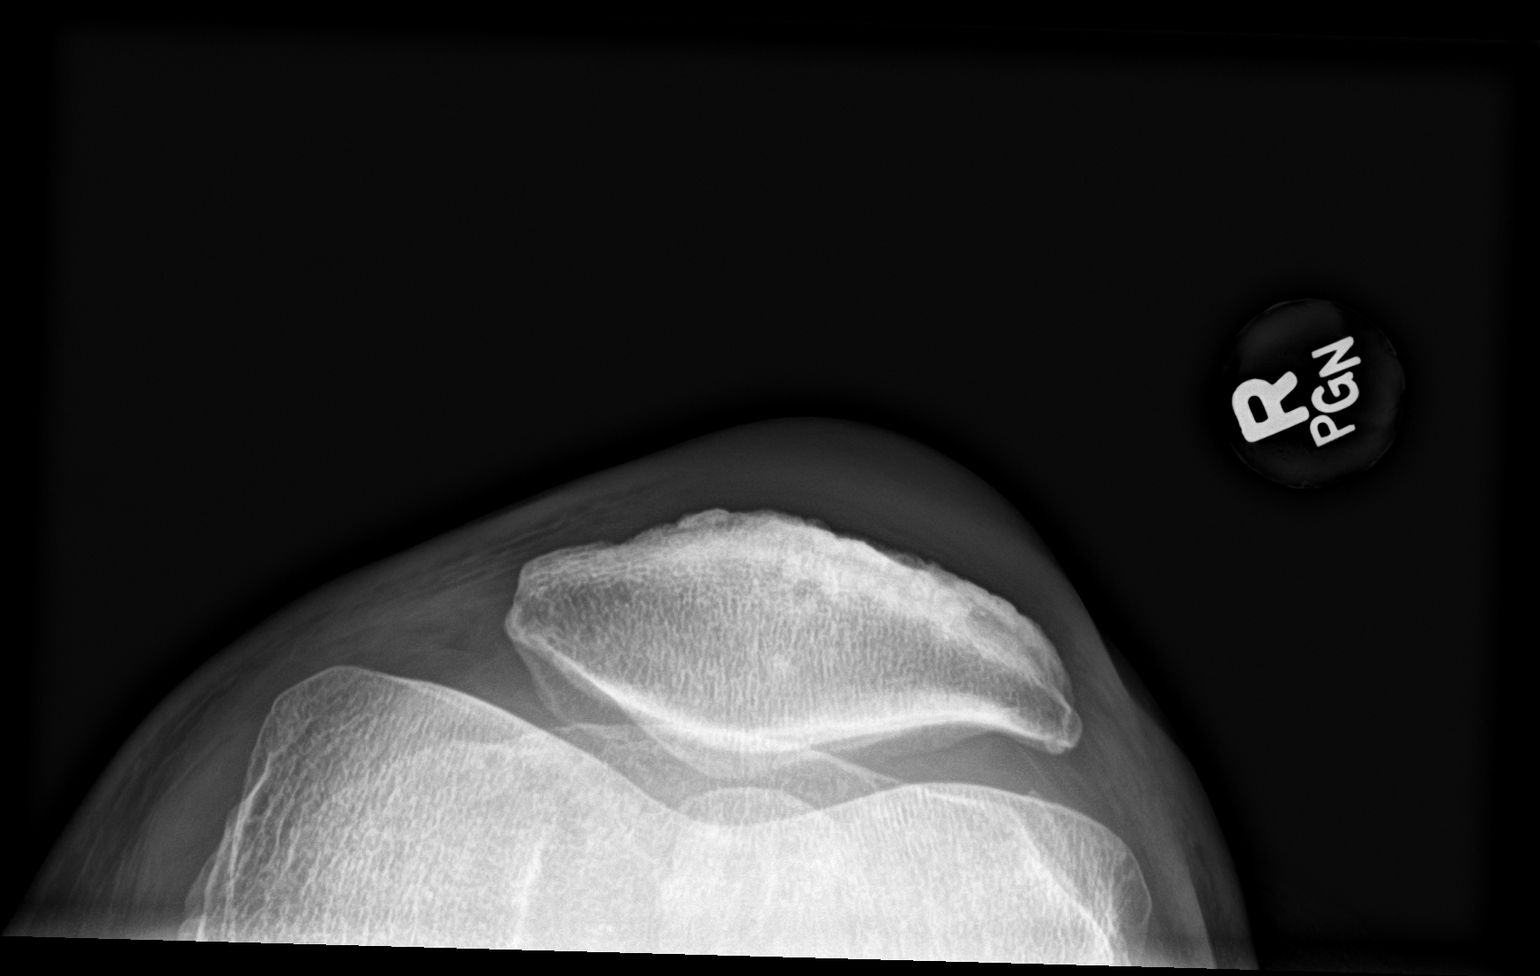

[3 of 3 positions shown; findings below may reference images not displayed]

FINDINGS: Mild tricompartmental degenerative changes with early medial
compartment joint space narrowing. Mild peaking of the tibial
spines. No acute bony findings or osteochondral lesion. No
chondrocalcinosis. No joint effusion.
IMPRESSION: Mild tricompartmental degenerative changes.

No acute bony findings.

## 2022-08-28 ENCOUNTER — Other Ambulatory Visit: Payer: Medicare Other

## 2022-08-28 DIAGNOSIS — R972 Elevated prostate specific antigen [PSA]: Secondary | ICD-10-CM

## 2022-08-29 LAB — PSA: Prostate Specific Ag, Serum: 6.4 ng/mL — ABNORMAL HIGH (ref 0.0–4.0)

## 2022-09-04 ENCOUNTER — Encounter: Payer: Self-pay | Admitting: Urology

## 2022-09-04 ENCOUNTER — Ambulatory Visit (INDEPENDENT_AMBULATORY_CARE_PROVIDER_SITE_OTHER): Payer: Medicare Other | Admitting: Urology

## 2022-09-04 VITALS — BP 129/74 | HR 71

## 2022-09-04 DIAGNOSIS — R972 Elevated prostate specific antigen [PSA]: Secondary | ICD-10-CM | POA: Diagnosis not present

## 2022-09-04 DIAGNOSIS — N401 Enlarged prostate with lower urinary tract symptoms: Secondary | ICD-10-CM

## 2022-09-04 LAB — URINALYSIS, ROUTINE W REFLEX MICROSCOPIC
Bilirubin, UA: NEGATIVE
Glucose, UA: NEGATIVE
Ketones, UA: NEGATIVE
Leukocytes,UA: NEGATIVE
Nitrite, UA: NEGATIVE
Protein,UA: NEGATIVE
RBC, UA: NEGATIVE
Specific Gravity, UA: 1.01 (ref 1.005–1.030)
Urobilinogen, Ur: 0.2 mg/dL (ref 0.2–1.0)
pH, UA: 5.5 (ref 5.0–7.5)

## 2022-09-04 NOTE — Progress Notes (Unsigned)
09/04/2022 10:57 AM   Evern Bio 1951-05-01 [id number]  Referring provider: Albina Billet, MD [address]   [place],  [place] [address]  No chief complaint on file.   HPI:  F/u -    1) PSA elevation-Sherrel is a 71 year old male referred for elevated PSA.  His November 2022 PSA was 4.6 and March 2023 PSA was 5.2.  He has no family history of prostate cancer.  He has no prior prostate biopsy.    2) BPH - on surveillance. Prostate 63 g on [place]. No meds or surgery. PVR was 35 ml.    Today, seen for the above. Prostate was 63 g on TRUS bx Apr 2023. His AUASS = 19. Not bothered. UA clear. His PSA is 6.4 in Oct 2023.    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        PMH: Past Medical History:  Diagnosis Date   High cholesterol     Surgical History: Past Surgical History:  Procedure Laterality Date   CHOLECYSTECTOMY     COLONOSCOPY WITH PROPOFOL N/A 12/15/2020   Procedure: COLONOSCOPY WITH PROPOFOL;  Surgeon: Robert Bellow, MD;  Location: [facility] ENDOSCOPY;  Service: Endoscopy;  Laterality: N/A;    Home Medications:  Allergies as of 09/04/2022       Reactions   Adhesive [tape]    Silicone Other (See Comments)        Medication List        Accurate as of September 04, 2022 10:57 AM. If you have any questions, ask your nurse or doctor.          co-enzyme Q-10 30 MG capsule Take 10 mg by mouth daily.   diclofenac Sodium 1 % Gel Commonly known as: VOLTAREN Apply 2 g topically 4 (four) times daily as needed.   Fish Oil 1000 MG Caps Take 1,000 mg by mouth.   multivitamin capsule Take 1 capsule by mouth daily.   RED YEAST RICE PO Take by mouth.        Allergies:  Allergies  Allergen Reactions   Adhesive [Tape]    Silicone Other (See Comments)    Family History: Family History  Problem Relation Age of Onset   Diabetes Mother    Diabetes Father     Social History:  reports that he quit smoking about 29 years ago. His smoking use  included cigarettes. He has a 15.00 pack-year smoking history. He has never used smokeless tobacco. He reports current alcohol use. He reports that he does not use drugs.   Physical Exam: BP 129/74   Pulse 71   Constitutional:  Alert and oriented, No acute distress. HEENT: [place] AT, moist mucus membranes.  Trachea midline, no masses. Cardiovascular: No clubbing, cyanosis, or edema. Respiratory: Normal respiratory effort, no increased work of breathing. GI: Abdomen is soft, nontender, nondistended, no abdominal masses GU: No CVA tenderness Skin: No rashes, bruises or suspicious lesions. Neurologic: Grossly intact, no focal deficits, moving all 4 extremities. Psychiatric: Normal mood and affect.  Laboratory Data: Lab Results  Component Value Date   WBC 11.6 (H) 03/18/2008   HGB 13.2 03/18/2008   HCT 38.4 (L) 03/18/2008   MCV 88.3 03/18/2008   PLT 146 (L) 03/18/2008    Lab Results  Component Value Date   CREATININE 0.86 03/18/2008    No results found for: "PSA"  No results found for: "TESTOSTERONE"  No results found for: "HGBA1C"  Urinalysis    Component Value Date/Time  COLORURINE YELLOW 03/17/2008 0233   APPEARANCEUR Clear 02/20/2022 1113   LABSPEC 1.025 03/17/2008 0233   PHURINE 6.5 03/17/2008 0233   GLUCOSEU Negative 02/20/2022 1113   HGBUR NEGATIVE 03/17/2008 0233   BILIRUBINUR Negative 02/20/2022 1113   KETONESUR TRACE (A) 03/17/2008 0233   PROTEINUR Negative 02/20/2022 1113   PROTEINUR NEGATIVE 03/17/2008 0233   UROBILINOGEN 0.2 03/17/2008 0233   NITRITE Negative 02/20/2022 1113   NITRITE NEGATIVE 03/17/2008 0233   LEUKOCYTESUR Negative 02/20/2022 1113    Lab Results  Component Value Date   LABMICR Comment 02/20/2022      Assessment & Plan:    1. Elevated PSA PSA has risen but remains a normal PSAD. Will check pMRI.   2. BPH - LUTS - he is not bothered. Disc surv, meds, and procedures. He will consider.    No follow-ups on file.  Festus Aloe, MD  [facility]  [address] [place], [place] [address] [phone]

## 2022-09-25 ENCOUNTER — Ambulatory Visit: Payer: Medicare Other | Admitting: Urology

## 2022-09-26 ENCOUNTER — Ambulatory Visit (HOSPITAL_COMMUNITY)
Admission: RE | Admit: 2022-09-26 | Discharge: 2022-09-26 | Disposition: A | Discharge status: Home / Self Care | Payer: Medicare Other | Source: Ambulatory Visit | Attending: Urology | Admitting: Urology

## 2022-09-26 ENCOUNTER — Other Ambulatory Visit: Payer: Self-pay | Admitting: Urology

## 2022-09-26 DIAGNOSIS — R972 Elevated prostate specific antigen [PSA]: Secondary | ICD-10-CM | POA: Diagnosis present

## 2022-09-26 MED ORDER — GADOBUTROL 1 MMOL/ML IV SOLN
9.0000 mL | Freq: Once | INTRAVENOUS | Status: AC | PRN
  Administered 2022-09-26: 9 mL via INTRAVENOUS

## 2022-10-04 LAB — LAB REPORT - SCANNED: EGFR: 80

## 2022-11-27 ENCOUNTER — Other Ambulatory Visit: Payer: Medicare Other

## 2022-11-27 ENCOUNTER — Other Ambulatory Visit: Payer: Self-pay

## 2022-11-27 DIAGNOSIS — R972 Elevated prostate specific antigen [PSA]: Secondary | ICD-10-CM

## 2022-11-28 LAB — PSA: Prostate Specific Ag, Serum: 7.2 ng/mL — ABNORMAL HIGH (ref 0.0–4.0)

## 2022-12-04 ENCOUNTER — Ambulatory Visit (INDEPENDENT_AMBULATORY_CARE_PROVIDER_SITE_OTHER): Payer: Medicare Other | Admitting: Urology

## 2022-12-04 ENCOUNTER — Encounter: Payer: Self-pay | Admitting: Urology

## 2022-12-04 VITALS — BP 148/74 | HR 72 | Ht 72.0 in | Wt 210.0 lb

## 2022-12-04 DIAGNOSIS — R972 Elevated prostate specific antigen [PSA]: Secondary | ICD-10-CM

## 2022-12-04 DIAGNOSIS — N138 Other obstructive and reflux uropathy: Secondary | ICD-10-CM

## 2022-12-04 DIAGNOSIS — N401 Enlarged prostate with lower urinary tract symptoms: Secondary | ICD-10-CM | POA: Diagnosis not present

## 2022-12-04 LAB — URINALYSIS, ROUTINE W REFLEX MICROSCOPIC
Bilirubin, UA: NEGATIVE
Glucose, UA: NEGATIVE
Ketones, UA: NEGATIVE
Leukocytes,UA: NEGATIVE
Nitrite, UA: NEGATIVE
Protein,UA: NEGATIVE
RBC, UA: NEGATIVE
Specific Gravity, UA: <1.005 — ABNORMAL LOW (ref 1.005–1.030)
Urobilinogen, Ur: 0.2 mg/dL (ref 0.2–1.0)
pH, UA: 6.5 (ref 5.0–7.5)

## 2022-12-04 MED ORDER — LEVOFLOXACIN 750 MG PO TABS: 750.0000 mg | ORAL_TABLET | Freq: Once | ORAL | 0 refills | Status: AC

## 2022-12-04 NOTE — Progress Notes (Signed)
12/04/2022 10:41 AM   Ian Hansen 1951-04-30 [id number]  Referring provider: Albina Billet, MD [address]   [place],  [place] [address]  No chief complaint on file.   HPI:  F/u -    1) PSA elevation-His November 2022 PSA was 4.6 and March 2023 PSA was 5.2.  He has no family history of prostate cancer. Bx benign Apr 2023. His PSA was 6.4 in Oct 2023.   Biopsy:  -Apr 2023 - benign, prostate 63 g     2) BPH - on surveillance. Prostate 63 g on [place]. No meds or surgery. PVR was 35 ml. His AUASS = 19. Not bothered.      Today, seen for the above. We went ahead with Nov 2023 pMRI which showed a 2.2 cm left PRIADS 4 lesion at the ant TZ mid to apex. Prostate 53 grams. His Jan 2024 PSA is 7.2.     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   PMH: Past Medical History:  Diagnosis Date   High cholesterol     Surgical History: Past Surgical History:  Procedure Laterality Date   CHOLECYSTECTOMY     COLONOSCOPY WITH PROPOFOL N/A 12/15/2020   Procedure: COLONOSCOPY WITH PROPOFOL;  Surgeon: Ian Bellow, MD;  Location: [facility] END[facility];  Service: Endoscopy;  Laterality: N/A;    Home Medications:  Allergies as of 12/04/2022       Reactions   Adhesive [tape]    Silicone Other (See Comments)        Medication List        Accurate as of December 04, 2022 10:41 AM. If you have any questions, ask your nurse or doctor.          co-enzyme Q-10 30 MG capsule Take 10 mg by mouth daily.   diclofenac Sodium 1 % Gel Commonly known as: VOLTAREN Apply 2 g topically 4 (four) times daily as needed.   Fish Oil 1000 MG Caps Take 1,000 mg by mouth.   multivitamin capsule Take 1 capsule by mouth daily.   RED YEAST RICE PO Take by mouth.   Tart Cherry 1200 MG Caps Take by mouth.        Allergies:  Allergies  Allergen Reactions   Adhesive [Tape]    Silicone Other (See Comments)    Family History: Family History  Problem Relation Age of Onset   Diabetes  Mother    Diabetes Father     Social History:  reports that he quit smoking about 30 years ago. His smoking use included cigarettes. He has a 15.00 pack-year smoking history. He has never used smokeless tobacco. He reports current alcohol use. He reports that he does not use drugs.   Physical Exam: BP (!) 148/74   Pulse 72   Ht 6' (1.829 m)   Wt 210 lb (95.3 kg)   BMI 28.48 kg/m   Constitutional:  Alert and oriented, No acute distress. HEENT: [place] AT, moist mucus membranes.  Trachea midline, no masses. Cardiovascular: No clubbing, cyanosis, or edema. Respiratory: Normal respiratory effort, no increased work of breathing. GI: Abdomen is soft, nontender, nondistended, no abdominal masses GU: No CVA tenderness Skin: No rashes, bruises or suspicious lesions. Neurologic: Grossly intact, no focal deficits, moving all 4 extremities. Psychiatric: Normal mood and affect.  Laboratory Data: Lab Results  Component Value Date   WBC 11.6 (H) 03/18/2008   HGB 13.2 03/18/2008   HCT 38.4 (L) 03/18/2008   MCV 88.3 03/18/2008  PLT 146 (L) 03/18/2008    Lab Results  Component Value Date   CREATININE 0.86 03/18/2008      Urinalysis    Component Value Date/Time   COLORURINE YELLOW 03/17/2008 0233   APPEARANCEUR Clear 09/04/2022 1106   LABSPEC 1.025 03/17/2008 0233   PHURINE 6.5 03/17/2008 0233   GLUCOSEU Negative 09/04/2022 1106   HGBUR NEGATIVE 03/17/2008 0233   BILIRUBINUR Negative 09/04/2022 1106   KETONESUR TRACE (A) 03/17/2008 0233   PROTEINUR Negative 09/04/2022 1106   PROTEINUR NEGATIVE 03/17/2008 0233   UROBILINOGEN 0.2 03/17/2008 0233   NITRITE Negative 09/04/2022 1106   NITRITE NEGATIVE 03/17/2008 0233   LEUKOCYTESUR Negative 09/04/2022 1106    Lab Results  Component Value Date   LABMICR Comment 02/20/2022    Pertinent Imaging: Nov 2023 p MRI images reviewed    Assessment & Plan:    1. Elevated PSA - disc PSA levels and we reviewed the MRI. Disc cog fusion vs  fusion at [facility]. He'd rather do cog fusion at [facility] and I feel like we could do that with reasonable accuracy. This will be scheduled.   - Urinalysis, Routine w reflex microscopic  2. BPH - cont to monitor   No follow-ups on file.  Ian Aloe, MD  [facility]  [address] [place],  [address] [phone]

## 2022-12-04 NOTE — Patient Instructions (Signed)
   Appointment [id number] Appointment Date:02/05  Location: [facility]   Prostate Biopsy Instructions  Stop all aspirin or blood thinners (aspirin, plavix, coumadin, warfarin, motrin, ibuprofen, advil, aleve, naproxen, naprosyn) for 7 days prior to the procedure.  If you have any questions about stopping these medications, please contact your primary care physician or cardiologist.  Having a light meal prior to the procedure is recommended.  If you are diabetic or have low blood sugar please bring a small snack or glucose tablet.  A Fleets enema is needed to be purchased over the counter at a local pharmacy and used 2 hours before you scheduled appointment.  This can be purchased over the counter at any pharmacy.  Antibiotics will be administered in the clinic at the time of the procedure and 1 tablet has been sent to your pharmacy. Please take the antibiotic as prescribed.    Please bring someone with you to the procedure to drive you home if you are given a valium to take prior to your procedure.   If you have any questions or concerns, please feel free to call the office at [phone] or send a Mychart message.    Thank you, [facility]

## 2022-12-11 DIAGNOSIS — M47812 Spondylosis without myelopathy or radiculopathy, cervical region: Secondary | ICD-10-CM | POA: Insufficient documentation

## 2022-12-25 ENCOUNTER — Other Ambulatory Visit: Payer: Self-pay | Admitting: Urology

## 2022-12-25 ENCOUNTER — Ambulatory Visit (INDEPENDENT_AMBULATORY_CARE_PROVIDER_SITE_OTHER): Payer: Medicare Other | Admitting: Urology

## 2022-12-25 ENCOUNTER — Ambulatory Visit (HOSPITAL_COMMUNITY)
Admission: RE | Admit: 2022-12-25 | Discharge: 2022-12-25 | Disposition: A | Discharge status: Home / Self Care | Payer: Medicare Other | Source: Ambulatory Visit | Attending: Urology | Admitting: Urology

## 2022-12-25 ENCOUNTER — Encounter (HOSPITAL_COMMUNITY): Payer: Self-pay

## 2022-12-25 DIAGNOSIS — C61 Malignant neoplasm of prostate: Secondary | ICD-10-CM

## 2022-12-25 DIAGNOSIS — R972 Elevated prostate specific antigen [PSA]: Secondary | ICD-10-CM | POA: Insufficient documentation

## 2022-12-25 MED ORDER — GENTAMICIN SULFATE 40 MG/ML IJ SOLN: 160.0000 mg | Freq: Once | INTRAMUSCULAR | Status: AC

## 2022-12-25 MED ORDER — GENTAMICIN SULFATE 40 MG/ML IJ SOLN
INTRAMUSCULAR | Status: AC
  Administered 2022-12-25: 160 mg via INTRAMUSCULAR
  Filled 2022-12-25: qty 4

## 2022-12-25 MED ORDER — LIDOCAINE HCL (PF) 2 % IJ SOLN
INTRAMUSCULAR | Status: AC
  Filled 2022-12-25: qty 10

## 2022-12-25 NOTE — Progress Notes (Signed)
Prostate Biopsy complete no signs of distress.

## 2022-12-25 NOTE — Progress Notes (Signed)
Prostate Biopsy Procedure  Ian Hansen is well today. He has no dysuria, hematuria or fever.   Informed consent was obtained after discussing risks/benefits of the procedure.  A time out was performed to ensure correct patient identity.  Pre-Procedure: - Last PSA Level: No results found for: "PSA" - Gentamicin given prophylactically - Levaquin 500 mg administered PO -DRE: benign - no hard area or nodule palpated.  -Transrectal Ultrasound performed revealing a  46.8 gm prostate -A 2 cm nodule was noted at the left anterior mid zone and this was targeted x 2 with the left medial and apical lateral biopsies. No median lobe noted.   Procedure: - Prostate block performed using 10 cc 1% lidocaine and biopsies taken from sextant areas, a total of 12 under ultrasound guidance.  Post-Procedure: - Patient tolerated the procedure well - He was counseled to seek immediate medical attention if experiences any severe pain, significant bleeding, or fevers - Return in one-two weeks to discuss biopsy results

## 2023-01-02 ENCOUNTER — Telehealth: Payer: Self-pay | Admitting: Urology

## 2023-01-02 DIAGNOSIS — C61 Malignant neoplasm of prostate: Secondary | ICD-10-CM

## 2023-01-02 NOTE — Telephone Encounter (Signed)
I called and discussed with Ian Hansen his stage, grade and prognosis. We discussed active surveillance vs treatment with surgery or radiation. He will consider and f/u to review in the office next week. He is doing well after the biopsy with no issues.

## 2023-01-08 ENCOUNTER — Ambulatory Visit (INDEPENDENT_AMBULATORY_CARE_PROVIDER_SITE_OTHER): Payer: Medicare Other | Admitting: Urology

## 2023-01-08 ENCOUNTER — Encounter: Payer: Self-pay | Admitting: Urology

## 2023-01-08 VITALS — BP 149/77 | HR 74 | Ht 72.0 in | Wt 210.0 lb

## 2023-01-08 DIAGNOSIS — C61 Malignant neoplasm of prostate: Secondary | ICD-10-CM | POA: Diagnosis not present

## 2023-01-08 NOTE — Progress Notes (Signed)
01/08/2023 4:08 PM   Ian Hansen 03/10/1951 [id number]  Referring provider: Albina Billet, MD [address]   [place],  [place] [address]  No chief complaint on file.   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 His November 2022 PSA was 4.6 and March 2023 PSA was 5.2.  He has no family history of prostate cancer. Bx benign Apr 2023. His PSA was 6.4 in Oct 2023. His Jan 2024 PSA is 7.2.     Biopsy:  #1) Apr 2023 - benign, prostate 63 g    #2) Feb 2024 (cognitive fusion) -  Low risk PCa PSA 7.2 T1c Prostate 47 grams GG1 disease, three cores 5-10%  Staging: Nov 2023 pMRI which showed a 2.2 cm left PRIADS 4 lesion at the ant TZ mid to apex. Staging negative. Prostate 53 grams.    2) BPH - on surveillance. Prostate 63 g on [place]. No meds or surgery. PVR was 35 ml. His AUASS = 19. Not bothered.     Today, seen for the above to discuss prostate biopsy results and management of prostate cancer. He has had upper neck injections for HAs.   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   PMH: Past Medical History:  Diagnosis Date   High cholesterol     Surgical History: Past Surgical History:  Procedure Laterality Date   CHOLECYSTECTOMY     COLONOSCOPY WITH PROPOFOL N/A 12/15/2020   Procedure: COLONOSCOPY WITH PROPOFOL;  Surgeon: Robert Bellow, MD;  Location: [facility];  Service: Endoscopy;  Laterality: N/A;    Home Medications:  Allergies as of 01/08/2023       Reactions   Adhesive [tape]    Silicone Other (See Comments)        Medication List        Accurate as of January 08, 2023  4:08 PM. If you have any questions, ask your nurse or doctor.          co-enzyme Q-10 30 MG capsule Take 10 mg by mouth daily.   diclofenac Sodium 1 % Gel Commonly known as: VOLTAREN Apply 2 g topically 4 (four) times daily as needed.   Fish Oil 1000 MG Caps Take 1,000 mg by mouth.   multivitamin capsule Take 1 capsule by  mouth daily.   RED YEAST RICE PO Take by mouth.   Tart Cherry 1200 MG Caps Take by mouth.        Allergies:  Allergies  Allergen Reactions   Adhesive [Tape]    Silicone Other (See Comments)    Family History: Family History  Problem Relation Age of Onset   Diabetes Mother    Diabetes Father     Social History:  reports that he quit smoking about 30 years ago. His smoking use included cigarettes. He has a 15.00 pack-year smoking history. He has never used smokeless tobacco. He reports current alcohol use. He reports that he does not use drugs.   Physical Exam: BP (!) 149/77   Pulse 74   Ht 6' (1.829 m)   Wt 210 lb (95.3 kg)   BMI 28.48 kg/m   Constitutional:  Alert and oriented, No acute distress. HEENT: [place] AT, moist mucus membranes.  Trachea midline, no masses. Cardiovascular: No clubbing, cyanosis, or edema. Respiratory: Normal respiratory effort, no increased work of breathing. GI: Abdomen is soft, nontender, nondistended, no abdominal masses GU: No CVA tenderness Skin: No rashes,  bruises or suspicious lesions. Neurologic: Grossly intact, no focal deficits, moving all 4 extremities. Psychiatric: Normal mood and affect.  Laboratory Data: Lab Results  Component Value Date   WBC 11.6 (H) 03/18/2008   HGB 13.2 03/18/2008   HCT 38.4 (L) 03/18/2008   MCV 88.3 03/18/2008   PLT 146 (L) 03/18/2008    Lab Results  Component Value Date   CREATININE 0.86 03/18/2008    No results found for: "PSA"  No results found for: "TESTOSTERONE"  No results found for: "HGBA1C"  Urinalysis    Component Value Date/Time   COLORURINE YELLOW 03/17/2008 0233   APPEARANCEUR Clear 12/04/2022 1046   LABSPEC 1.025 03/17/2008 0233   PHURINE 6.5 03/17/2008 0233   GLUCOSEU Negative 12/04/2022 1046   HGBUR NEGATIVE 03/17/2008 0233   BILIRUBINUR Negative 12/04/2022 1046   KETONESUR TRACE (A) 03/17/2008 0233   PROTEINUR Negative 12/04/2022 1046   PROTEINUR NEGATIVE 03/17/2008  0233   UROBILINOGEN 0.2 03/17/2008 0233   NITRITE Negative 12/04/2022 1046   NITRITE NEGATIVE 03/17/2008 0233   LEUKOCYTESUR Negative 12/04/2022 1046    Lab Results  Component Value Date   LABMICR Comment 12/04/2022    Pertinent Imaging: pMRI Nov 2023 - images reviewed   Path reviewed    Assessment & Plan:    1) PCa - I had a long discussion with the patient and his wife using his path report. We went his stage, grade and prognosis and the relevant anatomy using the prostate cancer poster on the door. We discussed the nature risks and benefits of active surveillance, radical prostatectomy, IMRT or brachytherapy. We discussed specifically how each treatment might affect the bowel, bladder and sexual function. We discussed how each treatment might effect salvage treatments. We discussed the role of other modalities in the treatment of prostate cancer including chemotherapy, HIFU and cryotherapy. We discussed progression and metastasis risk under AS protocol.   He will start AS and f/u in Jul 2024 with PSA prior.   No follow-ups on file.  Festus Aloe, MD  [facility]  [address] [place], [place] [address] [phone]

## 2023-05-28 ENCOUNTER — Other Ambulatory Visit: Payer: Medicare Other

## 2023-05-28 DIAGNOSIS — C61 Malignant neoplasm of prostate: Secondary | ICD-10-CM

## 2023-05-29 LAB — PSA: Prostate Specific Ag, Serum: 8.2 ng/mL — ABNORMAL HIGH (ref 0.0–4.0)

## 2023-06-04 ENCOUNTER — Encounter: Payer: Self-pay | Admitting: Urology

## 2023-06-04 ENCOUNTER — Ambulatory Visit: Payer: Medicare Other | Admitting: Urology

## 2023-06-04 VITALS — BP 123/73 | HR 61

## 2023-06-04 DIAGNOSIS — C61 Malignant neoplasm of prostate: Secondary | ICD-10-CM

## 2023-06-04 DIAGNOSIS — N401 Enlarged prostate with lower urinary tract symptoms: Secondary | ICD-10-CM | POA: Diagnosis not present

## 2023-06-04 DIAGNOSIS — N138 Other obstructive and reflux uropathy: Secondary | ICD-10-CM | POA: Diagnosis not present

## 2023-06-04 DIAGNOSIS — R972 Elevated prostate specific antigen [PSA]: Secondary | ICD-10-CM

## 2023-06-04 LAB — URINALYSIS, ROUTINE W REFLEX MICROSCOPIC
Bilirubin, UA: NEGATIVE
Glucose, UA: NEGATIVE
Ketones, UA: NEGATIVE
Leukocytes,UA: NEGATIVE
Nitrite, UA: NEGATIVE
Protein,UA: NEGATIVE
RBC, UA: NEGATIVE
Specific Gravity, UA: 1.01 (ref 1.005–1.030)
Urobilinogen, Ur: 0.2 mg/dL (ref 0.2–1.0)
pH, UA: 7.5 (ref 5.0–7.5)

## 2023-06-04 NOTE — Progress Notes (Unsigned)
06/04/2023 11:18 AM   Ian Hansen [birth year] [id number]  Referring provider: Jaclyn Shaggy, MD [address]   [place],  [place] [address]  No chief complaint on file.   HPI:  F/u -    1) prostate cancer - he was diagnosed with low risk PCa Feb 2024. His November 2022 PSA was 4.6 and March 2023 PSA was 5.2.  He has no family history of prostate cancer. Bx benign Apr 2023. His PSA was 6.4 in Oct 2023. pMRI in Nov 2023 showed a 2.2 cm left PIRADS 4 lesion. His Jan 2024 PSA is 7.2. He underwent cognitive fusion bx Feb 2024 with GG1 disease on biopsy.    Biopsy:  #1) Apr 2023 - benign, prostate 63 g    #2) Feb 2024 (cognitive fusion) -  Low risk PCa PSA 7.2 T1c Prostate 47 grams GG1 disease, three cores 5-10%   Staging: Nov 2023 pMRI which showed a 2.2 cm left PRIADS 4 lesion at the ant TZ mid to apex. Staging negative. Prostate 53 grams.    2) BPH - on surveillance. Prostate 47 - 63 g on [place]. No meds or surgery. PVR was 35 ml. His AUASS = 19. Not bothered.     Today, seen for the above. His Jul 2024 PSA is 8.2. No voiding complaints.    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    PMH: Past Medical History:  Diagnosis Date   High cholesterol     Surgical History: Past Surgical History:  Procedure Laterality Date   CHOLECYSTECTOMY     COLONOSCOPY WITH PROPOFOL N/A 12/15/2020   Procedure: COLONOSCOPY WITH PROPOFOL;  Surgeon: Earline Mayotte, MD;  Location: [facility];  Service: Endoscopy;  Laterality: N/A;    Home Medications:  Allergies as of 06/04/2023       Reactions   Adhesive [tape]    Silicone Other (See Comments)        Medication List        Accurate as of June 04, 2023 11:18 AM. If you have any questions, ask your nurse or doctor.          co-enzyme Q-10 30 MG capsule Take 10 mg by mouth daily.   diclofenac Sodium 1 % Gel Commonly known as: VOLTAREN Apply 2 g topically 4 (four) times daily as needed.   Fish Oil 1000 MG  Caps Take 1,000 mg by mouth.   multivitamin capsule Take 1 capsule by mouth daily.   RED YEAST RICE PO Take by mouth.   Tart Cherry 1200 MG Caps Take by mouth.        Allergies:  Allergies  Allergen Reactions   Adhesive [Tape]    Silicone Other (See Comments)    Family History: Family History  Problem Relation Age of Onset   Diabetes Mother    Diabetes Father     Social History:  reports that he quit smoking about 30 years ago. His smoking use included cigarettes. He started smoking about 60 years ago. He has a 15 pack-year smoking history. He has never used smokeless tobacco. He reports current alcohol use. He reports that he does not use drugs.   Physical Exam: There were no vitals taken for this visit.  Constitutional:  Alert and oriented, No acute distress. HEENT: [place] AT, moist mucus membranes.  Trachea midline, no masses. Cardiovascular: No clubbing, cyanosis, or edema. Respiratory: Normal respiratory effort, no increased work of breathing. GI: Abdomen is soft, nontender, nondistended, no abdominal  masses GU: No CVA tenderness Skin: No rashes, bruises or suspicious lesions. Neurologic: Grossly intact, no focal deficits, moving all 4 extremities. Psychiatric: Normal mood and affect.  Laboratory Data: Lab Results  Component Value Date   WBC 11.6 (H) 03/18/2008   HGB 13.2 03/18/2008   HCT 38.4 (L) 03/18/2008   MCV 88.3 03/18/2008   PLT 146 (L) 03/18/2008    Lab Results  Component Value Date   CREATININE 0.86 03/18/2008    No results found for: "PSA"  No results found for: "TESTOSTERONE"  No results found for: "HGBA1C"  Urinalysis    Component Value Date/Time   COLORURINE YELLOW 03/17/2008 0233   APPEARANCEUR Clear 12/04/2022 1046   LABSPEC 1.025 03/17/2008 0233   PHURINE 6.5 03/17/2008 0233   GLUCOSEU Negative 12/04/2022 1046   HGBUR NEGATIVE 03/17/2008 0233   BILIRUBINUR Negative 12/04/2022 1046   KETONESUR TRACE (A) 03/17/2008 0233    PROTEINUR Negative 12/04/2022 1046   PROTEINUR NEGATIVE 03/17/2008 0233   UROBILINOGEN 0.2 03/17/2008 0233   NITRITE Negative 12/04/2022 1046   NITRITE NEGATIVE 03/17/2008 0233   LEUKOCYTESUR Negative 12/04/2022 1046    Lab Results  Component Value Date   LABMICR Comment 12/04/2022    Pertinent Imaging: Mri images  Assessment & Plan:    1. Prostate cancer (HCC) Disc rising PSA a concern and something we need to follow closely. Also discussed going ahead with tx with XRT. He would like to cont surv. Will see back in 4 mo with PSA and consider MRI.   2. BPH with obstruction/lower urinary tract symptoms Cont surveillance.    No follow-ups on file.  Jerilee Field, MD  [facility]  [address] [place], [place] [address] [phone]

## 2023-10-01 ENCOUNTER — Other Ambulatory Visit: Payer: Medicare Other

## 2023-10-01 DIAGNOSIS — C61 Malignant neoplasm of prostate: Secondary | ICD-10-CM

## 2023-10-02 LAB — PSA: Prostate Specific Ag, Serum: 9.3 ng/mL — ABNORMAL HIGH (ref 0.0–4.0)

## 2023-10-06 LAB — LAB REPORT - SCANNED
A1c: 5.6
EGFR: 88
HM Hepatitis Screen: NEGATIVE

## 2023-10-08 ENCOUNTER — Encounter: Payer: Self-pay | Admitting: Urology

## 2023-10-08 ENCOUNTER — Ambulatory Visit: Payer: Medicare Other | Admitting: Urology

## 2023-10-08 VITALS — BP 125/72 | HR 64

## 2023-10-08 DIAGNOSIS — C61 Malignant neoplasm of prostate: Secondary | ICD-10-CM

## 2023-10-08 LAB — URINALYSIS, ROUTINE W REFLEX MICROSCOPIC
Bilirubin, UA: NEGATIVE
Glucose, UA: NEGATIVE
Ketones, UA: NEGATIVE
Leukocytes,UA: NEGATIVE
Nitrite, UA: NEGATIVE
Protein,UA: NEGATIVE
RBC, UA: NEGATIVE
Specific Gravity, UA: 1.015 (ref 1.005–1.030)
Urobilinogen, Ur: 0.2 mg/dL (ref 0.2–1.0)
pH, UA: 6 (ref 5.0–7.5)

## 2023-10-08 NOTE — Progress Notes (Unsigned)
10/08/2023 11:35 AM   Ian Hansen 1951/05/31 [id number]  Referring provider: Jaclyn Shaggy, MD [address]   [place],  [place] [address]  No chief complaint on file.   HPI:  F/u -    1) prostate cancer - he was diagnosed with low risk PCa Feb 2024. His November 2022 PSA was 4.6 and March 2023 PSA was 5.2.  He has no family history of prostate cancer. Bx benign Apr 2023. His PSA was 6.4 in Oct 2023. pMRI in Nov 2023 showed a 2.2 cm left PIRADS 4 lesion. His Jan 2024 PSA is 7.2. He underwent cognitive fusion bx Feb 2024 with GG1 disease on biopsy. His Jul 2024 PSA is 8.2.   Biopsy:  #1) Apr 2023 - benign, prostate 63 g    #2) Feb 2024 (cognitive fusion) -  Low risk PCa PSA 7.2 T1c Prostate 47 grams GG1 disease, three cores 5-10%   Staging: Nov 2023 pMRI which showed a 2.2 cm left PRIADS 4 lesion at the ant TZ mid to apex. Staging negative. Prostate 53 grams.    2) BPH - on surveillance. Prostate 47 - 63 g on [place]. No meds or surgery. PVR was 35 ml. His AUASS = 19. Not bothered.     Today, seen for the above. No voiding complaints.  November 2024 PSA rose to 9.3.   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     PMH: Past Medical History:  Diagnosis Date   High cholesterol     Surgical History: Past Surgical History:  Procedure Laterality Date   CHOLECYSTECTOMY     COLONOSCOPY WITH PROPOFOL N/A 12/15/2020   Procedure: COLONOSCOPY WITH PROPOFOL;  Surgeon: Earline Mayotte, MD;  Location: [facility] ENDOSCOPY;  Service: Endoscopy;  Laterality: N/A;    Home Medications:  Allergies as of 10/08/2023       Reactions   Adhesive [tape]    Silicone Other (See Comments)        Medication List        Accurate as of October 08, 2023 11:35 AM. If you have any questions, ask your nurse or doctor.          co-enzyme Q-10 30 MG capsule Take 10 mg by mouth daily.   diclofenac Sodium 1 % Gel Commonly known as: VOLTAREN Apply 2 g topically 4 (four) times daily  as needed.   Fish Oil 1000 MG Caps Take 1,000 mg by mouth.   multivitamin capsule Take 1 capsule by mouth daily.   RED YEAST RICE PO Take by mouth.   Tart Cherry 1200 MG Caps Take by mouth.        Allergies:  Allergies  Allergen Reactions   Adhesive [Tape]    Silicone Other (See Comments)    Family History: Family History  Problem Relation Age of Onset   Diabetes Mother    Diabetes Father     Social History:  reports that he quit smoking about 30 years ago. His smoking use included cigarettes. He started smoking about 60 years ago. He has a 15 pack-year smoking history. He has never used smokeless tobacco. He reports current alcohol use. He reports that he does not use drugs.   Physical Exam: BP 125/72   Pulse 64   Constitutional:  Alert and oriented, No acute distress. HEENT: [place] AT, moist mucus membranes.  Trachea midline, no masses. Cardiovascular: No clubbing, cyanosis, or edema. Respiratory: Normal respiratory effort, no increased work of breathing. GI: Abdomen is  soft, nontender, nondistended, no abdominal masses GU: No CVA tenderness Skin: No rashes, bruises or suspicious lesions. Neurologic: Grossly intact, no focal deficits, moving all 4 extremities. Psychiatric: Normal mood and affect.  Laboratory Data: Lab Results  Component Value Date   WBC 11.6 (H) 03/18/2008   HGB 13.2 03/18/2008   HCT 38.4 (L) 03/18/2008   MCV 88.3 03/18/2008   PLT 146 (L) 03/18/2008    Lab Results  Component Value Date   CREATININE 0.86 03/18/2008    No results found for: "PSA"  No results found for: "TESTOSTERONE"  No results found for: "HGBA1C"  Urinalysis    Component Value Date/Time   COLORURINE YELLOW 03/17/2008 0233   APPEARANCEUR Clear 06/04/2023 1126   LABSPEC 1.025 03/17/2008 0233   PHURINE 6.5 03/17/2008 0233   GLUCOSEU Negative 06/04/2023 1126   HGBUR NEGATIVE 03/17/2008 0233   BILIRUBINUR Negative 06/04/2023 1126   KETONESUR TRACE (A) 03/17/2008  0233   PROTEINUR Negative 06/04/2023 1126   PROTEINUR NEGATIVE 03/17/2008 0233   UROBILINOGEN 0.2 03/17/2008 0233   NITRITE Negative 06/04/2023 1126   NITRITE NEGATIVE 03/17/2008 0233   LEUKOCYTESUR Negative 06/04/2023 1126    Lab Results  Component Value Date   LABMICR Comment 06/04/2023    Pertinent Imaging: pMRI Nov 2023   Assessment & Plan:    1. Prostate cancer [facility]) Discussed the PSA rise.  We could go ahead and treat him based on his current values.  He still be considered low risk and eligible for brachytherapy or external beam.  Also radical prostatectomy. He is not ready to make a commitment to treatment.   We will risk stratify with another MRI and consider repeat biopsy with fusion.   Given the PSA rise we can better risk stratify with repeat staging and another biopsy. - Urinalysis, Routine w reflex microscopic   No follow-ups on file.  Jerilee Field, MD  [facility]  [address] [place], [place] [address] [phone]

## 2023-10-15 ENCOUNTER — Ambulatory Visit (HOSPITAL_COMMUNITY)
Admission: RE | Admit: 2023-10-15 | Discharge: 2023-10-15 | Disposition: A | Discharge status: Home / Self Care | Payer: Medicare Other | Source: Ambulatory Visit | Attending: Urology | Admitting: Urology

## 2023-10-15 DIAGNOSIS — C61 Malignant neoplasm of prostate: Secondary | ICD-10-CM | POA: Insufficient documentation

## 2023-10-15 MED ORDER — GADOBUTROL 1 MMOL/ML IV SOLN
10.0000 mL | Freq: Once | INTRAVENOUS | Status: AC | PRN
  Administered 2023-10-15: 10 mL via INTRAVENOUS

## 2023-12-31 ENCOUNTER — Other Ambulatory Visit: Payer: Medicare Other

## 2023-12-31 ENCOUNTER — Other Ambulatory Visit: Payer: Self-pay

## 2023-12-31 DIAGNOSIS — C61 Malignant neoplasm of prostate: Secondary | ICD-10-CM

## 2024-01-01 LAB — PSA: Prostate Specific Ag, Serum: 9.3 ng/mL — ABNORMAL HIGH (ref 0.0–4.0)

## 2024-01-07 ENCOUNTER — Ambulatory Visit: Payer: Medicare Other | Admitting: Urology

## 2024-01-07 VITALS — BP 139/59 | HR 59

## 2024-01-07 DIAGNOSIS — C61 Malignant neoplasm of prostate: Secondary | ICD-10-CM

## 2024-01-07 DIAGNOSIS — R972 Elevated prostate specific antigen [PSA]: Secondary | ICD-10-CM

## 2024-01-07 LAB — URINALYSIS, ROUTINE W REFLEX MICROSCOPIC
Bilirubin, UA: NEGATIVE
Glucose, UA: NEGATIVE
Leukocytes,UA: NEGATIVE
Nitrite, UA: NEGATIVE
Protein,UA: NEGATIVE
RBC, UA: NEGATIVE
Specific Gravity, UA: 1.03 (ref 1.005–1.030)
Urobilinogen, Ur: 1 mg/dL (ref 0.2–1.0)
pH, UA: 5.5 (ref 5.0–7.5)

## 2024-01-07 NOTE — Progress Notes (Signed)
 01/07/2024 11:04 AM   Ian Hansen 06-01-1951 [id number]  Referring provider: Jaclyn Shaggy, MD [address]   [place],  [place] [address]  No chief complaint on file.   HPI:  F/u -    1) prostate cancer - he was diagnosed with low risk PCa Feb 2024. His November 2022 PSA was 4.6 and March 2023 PSA was 5.2.  He has no family history of prostate cancer. Bx benign Apr 2023. His PSA was 6.4 in Oct 2023. pMRI in Nov 2023 showed a 2.2 cm left PIRADS 4 lesion. His Jan 2024 PSA is 7.2. He underwent cognitive fusion bx Feb 2024 with GG1 disease on biopsy. His Jul 2024 PSA is 8.2.   Biopsy:  #1) Apr 2023 - benign, prostate 63 g    #2) Feb 2024 (cognitive fusion) -  Low risk PCa PSA 7.2 T1c Prostate 47 grams GG1 disease, three cores 5-10%   Staging: Nov 2023 pMRI which showed a 2.2 cm left PRIADS 4 lesion at the ant TZ mid to apex. Staging negative. Prostate 53 grams.    2) BPH - on surveillance. Prostate 47 - 63 g on [place]. No meds or surgery. PVR was 35 ml. His AUASS = 19. Not bothered.     Today, seen for the above. No voiding complaints.  November 2024 PSA rose to 9.3. We repeat imaging with a Nov 2024 pMRI which was benign, prostate 51 grams. His Feb 2025 PSA remains stable at 9.3 (PSAD 0.18).    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 If you have any questions, ask your nurse or doctor.          co-enzyme Q-10 30 MG capsule Take  10 mg by mouth daily.   diclofenac Sodium 1 % Gel Commonly known as: VOLTAREN Apply 2 g topically 4 (four) times daily as needed.   Fish Oil 1000 MG Caps Take 1,000 mg by mouth.   multivitamin capsule Take 1 capsule by mouth daily.   RED YEAST RICE PO Take by mouth.   Tart Cherry 1200 MG Caps Take by mouth.        Allergies:  Allergies  Allergen Reactions   Adhesive [Tape]    Silicone Other (See Comments)    Family History: Family History  Problem Relation Age of Onset   Diabetes Mother    Diabetes Father     Social History:  reports that he quit smoking about 31 years ago. His smoking use included cigarettes. He started smoking about 61 years ago. He has a 15 pack-year smoking history. He has never used smokeless tobacco. He reports current alcohol use. He reports that he does not use drugs.   Physical Exam: There were no vitals taken for this visit.  Constitutional:  Alert and oriented, No acute distress. HEENT: [place] AT, moist mucus  membranes.  Trachea midline, no masses. Cardiovascular: No clubbing, cyanosis, or edema. Respiratory: Normal respiratory effort, no increased work of breathing. GI: Abdomen is soft, nontender, nondistended, no abdominal masses GU: No CVA tenderness Lymph: No cervical or inguinal lymphadenopathy. Skin: No rashes, bruises or suspicious lesions. Neurologic: Grossly intact, no focal deficits, moving all 4 extremities. Psychiatric: Normal mood and affect.  Laboratory Data: Lab Results  Component Value Date   WBC 11.6 (H) 03/18/2008   HGB 13.2 03/18/2008   HCT 38.4 (L) 03/18/2008   MCV 88.3 03/18/2008   PLT 146 (L) 03/18/2008    Lab Results  Component Value Date   CREATININE 0.86 03/18/2008    No results found for: "PSA"  No results found for: "TESTOSTERONE"  No results found for: "HGBA1C"  Urinalysis    Component Value Date/Time   COLORURINE YELLOW 03/17/2008 0233   APPEARANCEUR Clear 10/08/2023 1117   LABSPEC 1.025  03/17/2008 0233   PHURINE 6.5 03/17/2008 0233   GLUCOSEU Negative 10/08/2023 1117   HGBUR NEGATIVE 03/17/2008 0233   BILIRUBINUR Negative 10/08/2023 1117   KETONESUR TRACE (A) 03/17/2008 0233   PROTEINUR Negative 10/08/2023 1117   PROTEINUR NEGATIVE 03/17/2008 0233   UROBILINOGEN 0.2 03/17/2008 0233   NITRITE Negative 10/08/2023 1117   NITRITE NEGATIVE 03/17/2008 0233   LEUKOCYTESUR Negative 10/08/2023 1117    Lab Results  Component Value Date   LABMICR Comment 10/08/2023    Pertinent Imaging: pMRI     Assessment & Plan:    1. Elevated PSA (Primary) Two prior bx. Stable PSA. Borderline PSAD. Negative pMRI. Conitnue close monitoring. Discussed prior bx stage, grade and prognosis.    No follow-ups on file.  Jerilee Field, MD  [facility]  [address] [place], [place] [address] [phone]

## 2024-06-30 ENCOUNTER — Other Ambulatory Visit: Payer: Medicare Other

## 2024-06-30 DIAGNOSIS — C61 Malignant neoplasm of prostate: Secondary | ICD-10-CM

## 2024-07-01 ENCOUNTER — Ambulatory Visit: Payer: Self-pay

## 2024-07-01 DIAGNOSIS — R972 Elevated prostate specific antigen [PSA]: Secondary | ICD-10-CM

## 2024-07-01 LAB — PSA: Prostate Specific Ag, Serum: 10.5 ng/mL — ABNORMAL HIGH (ref 0.0–4.0)

## 2024-07-01 MED ORDER — LEVOFLOXACIN 750 MG PO TABS: 750.0000 mg | ORAL_TABLET | Freq: Once | ORAL | 0 refills | Status: AC

## 2024-07-01 NOTE — Telephone Encounter (Signed)
-----   Message from [name] sent at 07/01/2024  9:08 AM EDT ----- Let Ian Hansen know his PSA is up to 10.5, so with a negative MRI last year, I would recommend we repeat a prostate biopsy sometime this Fall. We can go ahead and schedule now or we can discuss more  next month when I see him back. Thank you.  ----- Message ----- From: Sammie Exie HERO, CMA Sent: 07/01/2024   7:52 AM EDT To: [name], MD  Please review appt 9/15 ----- Message ----- From: Rebecka Memos Lab Results In Sent: 07/01/2024   5:37 AM EDT To: [facility]

## 2024-07-01 NOTE — Telephone Encounter (Signed)
 Called pt to give results and recommendations per MD Eskridge. Pt voiced his understanding and scheduled bx for October 20th pt requested pre bx instructions be mailed to verified address letter sent

## 2024-07-07 ENCOUNTER — Ambulatory Visit: Payer: Medicare Other | Admitting: Urology

## 2024-07-23 ENCOUNTER — Ambulatory Visit: Admitting: Physician Assistant

## 2024-08-04 ENCOUNTER — Encounter: Payer: Self-pay | Admitting: Urology

## 2024-08-04 ENCOUNTER — Ambulatory Visit: Payer: Medicare Other | Admitting: Urology

## 2024-08-04 VITALS — BP 130/69 | HR 57

## 2024-08-04 DIAGNOSIS — C61 Malignant neoplasm of prostate: Secondary | ICD-10-CM

## 2024-08-04 LAB — URINALYSIS, ROUTINE W REFLEX MICROSCOPIC
Bilirubin, UA: NEGATIVE
Glucose, UA: NEGATIVE
Ketones, UA: NEGATIVE
Leukocytes,UA: NEGATIVE
Nitrite, UA: NEGATIVE
Protein,UA: NEGATIVE
RBC, UA: NEGATIVE
Specific Gravity, UA: 1.02 (ref 1.005–1.030)
Urobilinogen, Ur: 0.2 mg/dL (ref 0.2–1.0)
pH, UA: 6 (ref 5.0–7.5)

## 2024-08-04 NOTE — Progress Notes (Unsigned)
 08/04/2024 11:34 AM   Ian Hansen 1951/08/06 [id number]  Referring provider: Corlis Honor BROCKS, MD [address]   [place],  [place] [address]  No chief complaint on file.   HPI:  F/u -    1) prostate cancer - he was diagnosed with low risk PCa Feb 2024. His November 2022 PSA was 4.6 and March 2023 PSA was 5.2.  He has no family history of prostate cancer. Bx benign Apr 2023. His PSA was 6.4 in Oct 2023. pMRI in Nov 2023 showed a 2.2 cm left PIRADS 4 lesion. His Jan 2024 PSA is 7.2. He underwent cognitive fusion bx Feb 2024 with GG1 disease on biopsy. His Jul 2024 PSA is 8.2.   Biopsy:  #1) Apr 2023 - benign, prostate 63 g    #2) Feb 2024 (cognitive fusion) -  Low risk PCa PSA 7.2 T1c Prostate 47 grams GG1 disease, three cores 5-10%   Staging: Nov 2023 pMRI which showed a 2.2 cm left PRIADS 4 lesion at the ant TZ mid to apex. Staging negative. Prostate 53 grams.    2) BPH - on surveillance. Prostate 47 - 63 g on US . No meds or surgery. PVR was 35 ml. His AUASS = 19. Not bothered.     Today, seen for the above. No voiding complaints.  November 2024 PSA rose to 9.3. We repeat imaging with a Nov 2024 pMRI which was benign, prostate 51 grams. His Feb 2025 PSA remains stable at 9.3 (PSAD 0.18). His AUG 2025 PSA 10.5. Bx scheduled. No dysuria or gross hematuria.    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 If you have any questions, ask  your nurse or doctor.          co-enzyme Q-10 30 MG capsule Take 10 mg by mouth daily.   diclofenac Sodium 1 % Gel Commonly known as: VOLTAREN Apply 2 g topically 4 (four) times daily as needed.   Fish Oil 1000 MG Caps Take 1,000 mg by mouth.   multivitamin capsule Take 1 capsule by mouth daily.   RED YEAST RICE PO Take by mouth.   rosuvastatin 10 MG tablet Commonly known as: CRESTOR Take 10 mg by mouth daily.   Tart Cherry 1200 MG Caps Take by mouth.        Allergies:  Allergies  Allergen Reactions   Adhesive [Tape]    Silicone Other (See Comments)    Family History: Family History  Problem Relation Age of Onset   Diabetes Mother    Diabetes Father     Social History:  reports that he quit smoking about 31 years ago. His smoking use included cigarettes. He started smoking about 61 years ago. He has a 15 pack-year smoking history. He has never used smokeless tobacco. He reports current alcohol use. He reports that he does not  use drugs.   Physical Exam: BP 130/69   Pulse (!) 57   Constitutional:  Alert and oriented, No acute distress. HEENT: [place] AT, moist mucus membranes.  Trachea midline, no masses. Cardiovascular: No clubbing, cyanosis, or edema. Respiratory: Normal respiratory effort, no increased work of breathing. GI: Abdomen is soft, nontender, nondistended, no abdominal masses GU: No CVA tenderness Skin: No rashes, bruises or suspicious lesions. Neurologic: Grossly intact, no focal deficits, moving all 4 extremities. Psychiatric: Normal mood and affect.  Laboratory Data: Lab Results  Component Value Date   WBC 11.6 (H) 03/18/2008   HGB 13.2 03/18/2008   HCT 38.4 (L) 03/18/2008   MCV 88.3 03/18/2008   PLT 146 (L) 03/18/2008    Lab Results  Component Value Date   CREATININE 0.86 03/18/2008    No results found for: PSA  No results found for: TESTOSTERONE  No results found for: HGBA1C  Urinalysis    Component Value  Date/Time   COLORURINE YELLOW 03/17/2008 0233   APPEARANCEUR Clear 01/07/2024 1112   LABSPEC 1.025 03/17/2008 0233   PHURINE 6.5 03/17/2008 0233   GLUCOSEU Negative 01/07/2024 1112   HGBUR NEGATIVE 03/17/2008 0233   BILIRUBINUR Negative 01/07/2024 1112   KETONESUR TRACE (A) 03/17/2008 0233   PROTEINUR Negative 01/07/2024 1112   PROTEINUR NEGATIVE 03/17/2008 0233   UROBILINOGEN 0.2 03/17/2008 0233   NITRITE Negative 01/07/2024 1112   NITRITE NEGATIVE 03/17/2008 0233   LEUKOCYTESUR Negative 01/07/2024 1112    Lab Results  Component Value Date   LABMICR Comment 01/07/2024    Pertinent Imaging: Nov 2024 pMRI report   Assessment & Plan:    1. Prostate cancer - again discussed PSAV fast and PSAD getting higher. Keep plan for scheduled prostate bx - discussed nature r/b/a. Also management of PCa - surveillance vs tx with rad or surgery.  - Urinalysis, Routine w reflex microscopic; Future   No follow-ups on file.  [name], MD  [facility]  [address] [place], [place] [address] [phone]

## 2024-08-04 NOTE — Telephone Encounter (Signed)
 FYI and advise please

## 2024-09-08 ENCOUNTER — Encounter (HOSPITAL_COMMUNITY): Payer: Self-pay

## 2024-09-08 ENCOUNTER — Ambulatory Visit (HOSPITAL_COMMUNITY)
Admission: RE | Admit: 2024-09-08 | Discharge: 2024-09-08 | Disposition: A | Discharge status: Home / Self Care | Source: Ambulatory Visit | Attending: Urology | Admitting: Urology

## 2024-09-08 ENCOUNTER — Ambulatory Visit: Admitting: Urology

## 2024-09-08 ENCOUNTER — Other Ambulatory Visit: Payer: Self-pay | Admitting: Urology

## 2024-09-08 DIAGNOSIS — R972 Elevated prostate specific antigen [PSA]: Secondary | ICD-10-CM | POA: Diagnosis present

## 2024-09-08 DIAGNOSIS — C61 Malignant neoplasm of prostate: Secondary | ICD-10-CM

## 2024-09-08 DIAGNOSIS — N4231 Prostatic intraepithelial neoplasia: Secondary | ICD-10-CM | POA: Diagnosis not present

## 2024-09-08 MED ORDER — LIDOCAINE HCL (PF) 2 % IJ SOLN
10.0000 mL | Freq: Once | INTRAMUSCULAR | Status: AC
  Administered 2024-09-08: 10 mL

## 2024-09-08 MED ORDER — GENTAMICIN SULFATE 40 MG/ML IJ SOLN
INTRAMUSCULAR | Status: AC
  Filled 2024-09-08: qty 4

## 2024-09-08 MED ORDER — GENTAMICIN SULFATE 40 MG/ML IJ SOLN
160.0000 mg | Freq: Once | INTRAMUSCULAR | Status: AC
  Administered 2024-09-08: 160 mg via INTRAMUSCULAR

## 2024-09-08 MED ORDER — LIDOCAINE HCL (PF) 2 % IJ SOLN
INTRAMUSCULAR | Status: AC
  Filled 2024-09-08: qty 10

## 2024-09-08 NOTE — Progress Notes (Signed)
 Jiovani presents today for surveillance transrectal ultrasound and prostate biopsy.  He is well today.  No fever.  No dysuria.  See 08/04/2024 note for full details.  Prostate Biopsy Procedure   Informed consent was obtained after discussing risks/benefits of the procedure.  A time out was performed to ensure correct patient identity.  Pre-Procedure: -August 2025 PSA 10.5 - Gentamicin  given prophylactically - Levaquin  500 mg administered PO -DRE-prostate about 40 g and smooth without hard area or nodule. -Transrectal Ultrasound performed revealing a 66 g prostate (5.7 x 4.4 x 5.01).  He has a small median lobe or about a centimeter of IPP and factoring that and prostate measures about 50 g.  -No significant hypoechoic or median lobe noted  Procedure: - Prostate block performed using 10 cc 1% lidocaine  and biopsies taken from sextant areas, a total of 12 under ultrasound guidance.  Post-Procedure: - Patient tolerated the procedure well - He was counseled to seek immediate medical attention if experiences any severe pain, significant bleeding, or fevers - Return in one to 2 week to discuss biopsy results

## 2024-09-08 NOTE — Progress Notes (Signed)
PT tolerated prostate biopsy procedure well today. Labs obtained and sent for pathology by Richard from ultrasound. PT ambulatory at discharge with no acute distress noted and verbalized understanding of discharge instructions.

## 2024-09-09 LAB — SURGICAL PATHOLOGY

## 2024-09-22 ENCOUNTER — Ambulatory Visit (INDEPENDENT_AMBULATORY_CARE_PROVIDER_SITE_OTHER): Admitting: Urology

## 2024-09-22 VITALS — BP 128/76 | HR 61

## 2024-09-22 DIAGNOSIS — C61 Malignant neoplasm of prostate: Secondary | ICD-10-CM | POA: Diagnosis not present

## 2024-09-22 NOTE — Progress Notes (Unsigned)
 09/22/2024 4:01 PM   Ian Hansen [birth year] [id number]  Referring provider: No referring provider defined for this encounter.  No chief complaint on file.   HPI:  F/u -    1) prostate cancer - he was diagnosed with low risk PCa Feb 2024. His November 2022 PSA was 4.6 and March 2023 PSA was 5.2.  He has no family history of prostate cancer. Bx benign Apr 2023. His PSA was 6.4 in Oct 2023. pMRI in Nov 2023 showed a 2.2 cm left PIRADS 4 lesion. His Jan 2024 PSA is 7.2. He underwent cognitive fusion bx Feb 2024 with GG1 disease on biopsy. His Jul 2024 PSA is 8.2. November 2024 PSA rose to 9.3. We repeat imaging with a Nov 2024 pMRI which was benign, prostate 51 grams. His Feb 2025 PSA remains stable at 9.3 (PSAD 0.18). His AUG 2025 PSA 10.5.     Biopsy:  #1) Apr 2023 - benign, prostate 63 g    #2) Feb 2024 (cognitive fusion) -  Low risk PCa PSA 7.2 T1c Prostate 47 grams GG1 disease, three cores 5-10%  #3)  Oct 2025 -low to intermediate risk prostate cancer- PSA 10.5 T1c Prostate 66 g (PSAD 0.16) GG1 disease, 3 cores, 5-10%   Staging: Nov 2023 pMRI which showed a 2.2 cm left PRIADS 4 lesion at the ant TZ mid to apex. Staging negative. Prostate 53 grams.  Nov 2024 pMRI which was benign, prostate 51 grams.   2) BPH - on surveillance. Prostate 47 - 63 g on US . No meds or surgery. PVR was 35 ml. His AUASS = 19. Not bothered.     Today, seen for the above. No voiding complaints.  November 2024 PSA rose to 9.3. We repeat imaging with a Nov 2024 pMRI which was benign, prostate 51 grams. His Feb 2025 PSA remains stable at 9.3 (PSAD 0.18). His AUG 2025 PSA 10.5.     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 If you have any questions, ask your nurse or doctor.          co-enzyme Q-10 30 MG capsule Take 10 mg by mouth daily.   diclofenac Sodium 1 % Gel Commonly known as: VOLTAREN Apply 2 g topically 4 (four) times daily as needed.   Fish Oil 1000 MG Caps Take 1,000 mg by mouth.   multivitamin capsule Take 1 capsule by mouth daily.   RED YEAST RICE PO Take by mouth.   rosuvastatin 10 MG tablet Commonly known as: CRESTOR Take 10 mg by mouth daily.   Tart Cherry 1200 MG Caps Take by mouth.        Allergies:  Allergies  Allergen Reactions   Adhesive [Tape]    Silicone Other (See Comments)    Family History: Family History  Problem Relation Age of Onset   Diabetes  Mother    Diabetes Father     Social History:  reports that he quit smoking about 31 years ago. His smoking use included cigarettes. He started smoking about 61 years ago. He has a 15 pack-year smoking history. He has never used smokeless tobacco. He reports current alcohol use. He reports that he does not use drugs.   Physical Exam: BP 128/76   Pulse 61   Constitutional:  Alert and oriented, No acute distress. HEENT: [place] AT, moist mucus membranes.  Trachea midline, no masses. Cardiovascular: No clubbing, cyanosis, or edema. Respiratory: Normal respiratory effort, no increased work of breathing. GI: Abdomen is soft, nontender, nondistended, no abdominal masses GU: No CVA tenderness Lymph: No cervical or inguinal lymphadenopathy. Skin: No rashes, bruises or suspicious lesions. Neurologic: Grossly intact, no focal deficits, moving all 4 extremities. Psychiatric: Normal mood and affect.  Laboratory Data: Lab Results  Component Value  Date   WBC 11.6 (H) 03/18/2008   HGB 13.2 03/18/2008   HCT 38.4 (L) 03/18/2008   MCV 88.3 03/18/2008   PLT 146 (L) 03/18/2008    Lab Results  Component Value Date   CREATININE 0.86 03/18/2008    No results found for: PSA  No results found for: TESTOSTERONE  No results found for: HGBA1C  Urinalysis    Component Value Date/Time   COLORURINE YELLOW 03/17/2008 0233   APPEARANCEUR Clear 08/04/2024 1248   LABSPEC 1.025 03/17/2008 0233   PHURINE 6.5 03/17/2008 0233   GLUCOSEU Negative 08/04/2024 1248   HGBUR NEGATIVE 03/17/2008 0233   BILIRUBINUR Negative 08/04/2024 1248   KETONESUR TRACE (A) 03/17/2008 0233   PROTEINUR Negative 08/04/2024 1248   PROTEINUR NEGATIVE 03/17/2008 0233   UROBILINOGEN 0.2 03/17/2008 0233   NITRITE Negative 08/04/2024 1248   NITRITE NEGATIVE 03/17/2008 0233   LEUKOCYTESUR Negative 08/04/2024 1248    Lab Results  Component Value Date   LABMICR Comment 08/04/2024    Pertinent Imaging, notes, labs: Reviewed path report   Assessment & Plan:    Prostate cancer-using his path report and video board as a guide we discussed his stage grade and prognosis.  Overall he is moved from a stage I to IIA based on PSA crossing the 10 threshold.  We discussed the nature risk and benefits of surveillance, surgery, radiation with external beam or brachytherapy.  Discussed the role of gold seeds and SpaceOAR gel.  All questions answered.  If we continue surveillance we also discussed if PSA gets up near 15 obtaining a PET scan to help localize any cancer in the prostate. He wants to continue surveillance. Discussed risk of stage and grade progression treatment intensification.   No follow-ups on file.  [name], MD  [facility]  [address] [place], [place] [address] [phone]

## 2024-11-24 ENCOUNTER — Encounter: Payer: Self-pay | Admitting: Physician Assistant

## 2024-11-24 ENCOUNTER — Ambulatory Visit: Admitting: Physician Assistant

## 2024-11-24 VITALS — BP 136/77 | HR 55 | Temp 97.7°F | Ht 72.0 in | Wt 217.4 lb

## 2024-11-24 DIAGNOSIS — C61 Malignant neoplasm of prostate: Secondary | ICD-10-CM | POA: Insufficient documentation

## 2024-11-24 DIAGNOSIS — Z7689 Persons encountering health services in other specified circumstances: Secondary | ICD-10-CM

## 2024-11-24 DIAGNOSIS — E781 Pure hyperglyceridemia: Secondary | ICD-10-CM | POA: Diagnosis not present

## 2024-11-24 DIAGNOSIS — Z23 Encounter for immunization: Secondary | ICD-10-CM | POA: Diagnosis not present

## 2024-11-24 NOTE — Assessment & Plan Note (Signed)
 No current complaints. Follows regularly with urology. Continue current treatment plan.

## 2024-11-24 NOTE — Assessment & Plan Note (Signed)
 Stable. Continue with current management without changes. Discussed healthy diet and lifestyle. Lipid panel today.

## 2024-11-24 NOTE — Progress Notes (Signed)
 "  New Patient Office Visit  Subjective    Patient ID: Ian Hansen, male    DOB: [birth year]  Age: 74 y.o. MRN: [id number]  CC:  Chief Complaint  Patient presents with   Establish Care    PT stated no concerns    HPI Ian Hansen presents to establish care  Discussed the use of AI scribe software for clinical note transcription with the patient, who gave verbal consent to proceed.  History of Present Illness Ian Hansen is a 74 year old male who presents to establish care.  Past medical history is significant for prostate cancer and hyperlipidemia. He is taking cholesterol medication, with last cholesterol labs done over a year ago. He missed his last physical when his prior doctor retired. He is followed by urology for prostate cancer every six months. His last visit was in October and the next is planned for April.  He has no shortness of breath, chest pain, leg swelling, or urinary symptoms. He has not received a flu shot this year. He previously received the shingles vaccine and is unsure about pneumonia vaccination.  His past history includes cholecystectomy around 2007-2008. He quit smoking over 30 years ago, drinks alcohol occasionally, and does not use drugs. He does not have concerns aside from establishing care, and does not need refills at this time.    Outpatient Encounter Medications as of 11/24/2024  Medication Sig   diclofenac Sodium (VOLTAREN) 1 % GEL Apply 2 g topically 4 (four) times daily as needed. (Patient not taking: Reported on 09/22/2024)   rosuvastatin (CRESTOR) 10 MG tablet Take 10 mg by mouth daily.   Tart Cherry 1200 MG CAPS Take by mouth.   [DISCONTINUED] co-enzyme Q-10 30 MG capsule Take 10 mg by mouth daily. (Patient not taking: Reported on 09/22/2024)   [DISCONTINUED] Multiple Vitamin (MULTIVITAMIN) capsule Take 1 capsule by mouth daily. (Patient not taking: Reported on 09/22/2024)   [DISCONTINUED] Omega-3 Fatty Acids (FISH OIL) 1000 MG  CAPS Take 1,000 mg by mouth. (Patient not taking: Reported on 09/22/2024)   [DISCONTINUED] Red Yeast Rice Extract (RED YEAST RICE PO) Take by mouth. (Patient not taking: Reported on 09/22/2024)   No facility-administered encounter medications on file as of 11/24/2024.    Past Medical History:  Diagnosis Date   High cholesterol     Past Surgical History:  Procedure Laterality Date   CHOLECYSTECTOMY     COLONOSCOPY WITH PROPOFOL  N/A 12/15/2020   Procedure: COLONOSCOPY WITH PROPOFOL ;  Surgeon: Dessa Reyes ORN, MD;  Location: [facility] ENDOSCOPY;  Service: Endoscopy;  Laterality: N/A;    Family History  Problem Relation Age of Onset   Diabetes Mother    Diabetes Father     Social History   Socioeconomic History   Marital status: Married    Spouse name: Not on file   Number of children: Not on file   Years of education: Not on file   Highest education level: Not on file  Occupational History   Not on file  Tobacco Use   Smoking status: Former    Current packs/day: 0.00    Average packs/day: 0.5 packs/day for 30.0 years (15.0 ttl pk-yrs)    Types: Cigarettes    Start date: 11/20/1962    Quit date: 11/20/1992    Years since quitting: 32.0   Smokeless tobacco: Never  Vaping Use   Vaping status: Never Used  Substance and Sexual Activity   Alcohol use: Yes    Comment: occasional  Drug use: No   Sexual activity: Not Currently  Other Topics Concern   Not on file  Social History Narrative   Not on file   Social Drivers of Health   Tobacco Use: Medium Risk (11/24/2024)   Patient History    Smoking Tobacco Use: Former    Smokeless Tobacco Use: Never    Passive Exposure: Not on Actuary Strain: Not on file  Food Insecurity: Not on file  Transportation Needs: Not on file  Physical Activity: Not on file  Stress: Not on file  Social Connections: Not on file  Intimate Partner Violence: Not on file  Depression (PHQ2-9): Low Risk (11/24/2024)   Depression (PHQ2-9)     PHQ-2 Score: 0  Alcohol Screen: Not on file  Housing: Not on file  Utilities: Not on file  Health Literacy: Not on file    Review of Systems  Constitutional:  Negative for activity change, appetite change, fatigue and fever.  Eyes:  Negative for visual disturbance.  Respiratory:  Negative for cough and shortness of breath.   Cardiovascular:  Negative for chest pain and leg swelling.  Genitourinary: Negative.   Neurological:  Negative for light-headedness and headaches.         Objective    BP 136/77   Pulse (!) 55   Temp 97.7 F (36.5 C)   Ht 6' (1.829 m)   Wt 217 lb 6.4 oz (98.6 kg)   SpO2 99%   BMI 29.48 kg/m   Physical Exam Constitutional:      General: He is not in acute distress.    Appearance: Normal appearance. He is normal weight. He is not ill-appearing.  HENT:     Head: Normocephalic and atraumatic.     Mouth/Throat:     Mouth: Mucous membranes are moist.     Pharynx: Oropharynx is clear.  Eyes:     Extraocular Movements: Extraocular movements intact.     Conjunctiva/sclera: Conjunctivae normal.  Cardiovascular:     Rate and Rhythm: Normal rate and regular rhythm.     Heart sounds: Normal heart sounds. No murmur heard. Pulmonary:     Effort: Pulmonary effort is normal.     Breath sounds: Normal breath sounds. No wheezing, rhonchi or rales.  Musculoskeletal:     Right lower leg: No edema.     Left lower leg: No edema.  Skin:    General: Skin is warm and dry.  Neurological:     General: No focal deficit present.     Mental Status: He is alert and oriented to person, place, and time.  Psychiatric:        Mood and Affect: Mood normal.        Behavior: Behavior normal.       Assessment & Plan:  Encounter to establish care  Prostate cancer [facility]) Assessment & Plan: No current complaints. Follows regularly with urology. Continue current treatment plan.    Pure hypertriglyceridemia Assessment & Plan: Stable. Continue with current management  without changes. Discussed healthy diet and lifestyle. Lipid panel today.   Orders: -     Lipid panel  Immunization due  Other orders -     Flu vaccine HIGH DOSE PF(Fluzone Trivalent)    Return in about 3 months (around 02/22/2025) for phys .   Wynonna Fitzhenry, PA-C  "

## 2024-11-25 ENCOUNTER — Ambulatory Visit: Payer: Self-pay | Admitting: Physician Assistant

## 2024-11-25 LAB — LIPID PANEL
Chol/HDL Ratio: 3.3 ratio (ref 0.0–5.0)
Cholesterol, Total: 170 mg/dL (ref 100–199)
HDL: 52 mg/dL
LDL Chol Calc (NIH): 92 mg/dL (ref 0–99)
Triglycerides: 151 mg/dL — ABNORMAL HIGH (ref 0–149)
VLDL Cholesterol Cal: 26 mg/dL (ref 5–40)

## 2025-02-23 ENCOUNTER — Encounter: Admitting: Physician Assistant

## 2025-03-17 ENCOUNTER — Other Ambulatory Visit

## 2025-03-23 ENCOUNTER — Ambulatory Visit: Admitting: Urology
